# Patient Record
Sex: Female | Born: 1960 | Race: White | Hispanic: No | Marital: Married | Smoking: Never smoker
Health system: Southern US, Community
[De-identification: ages and names within clinical notes are randomized; demographics above are authoritative.]

## PROBLEM LIST (undated history)

## (undated) DIAGNOSIS — F419 Anxiety disorder, unspecified: Secondary | ICD-10-CM

## (undated) HISTORY — PX: TUBAL LIGATION: SHX77

---

## 2008-03-04 ENCOUNTER — Ambulatory Visit: Payer: Self-pay | Admitting: Internal Medicine

## 2010-07-24 ENCOUNTER — Ambulatory Visit: Payer: Self-pay | Admitting: Internal Medicine

## 2011-10-13 ENCOUNTER — Ambulatory Visit: Payer: Self-pay | Admitting: Family Medicine

## 2013-11-06 ENCOUNTER — Ambulatory Visit: Payer: Self-pay | Admitting: Emergency Medicine

## 2015-06-29 ENCOUNTER — Observation Stay
Admission: EM | Admit: 2015-06-29 | Discharge: 2015-06-30 | Disposition: A | Discharge status: Home / Self Care | Payer: BLUE CROSS/BLUE SHIELD | Attending: Orthopedic Surgery | Admitting: Orthopedic Surgery

## 2015-06-29 ENCOUNTER — Emergency Department: Payer: BLUE CROSS/BLUE SHIELD

## 2015-06-29 ENCOUNTER — Encounter: Payer: Self-pay | Admitting: Emergency Medicine

## 2015-06-29 DIAGNOSIS — S52572A Other intraarticular fracture of lower end of left radius, initial encounter for closed fracture: Principal | ICD-10-CM | POA: Insufficient documentation

## 2015-06-29 DIAGNOSIS — S5291XA Unspecified fracture of right forearm, initial encounter for closed fracture: Secondary | ICD-10-CM | POA: Diagnosis present

## 2015-06-29 DIAGNOSIS — W1789XA Other fall from one level to another, initial encounter: Secondary | ICD-10-CM | POA: Diagnosis not present

## 2015-06-29 DIAGNOSIS — S5292XA Unspecified fracture of left forearm, initial encounter for closed fracture: Secondary | ICD-10-CM

## 2015-06-29 DIAGNOSIS — S62101A Fracture of unspecified carpal bone, right wrist, initial encounter for closed fracture: Secondary | ICD-10-CM | POA: Diagnosis present

## 2015-06-29 DIAGNOSIS — W19XXXA Unspecified fall, initial encounter: Secondary | ICD-10-CM

## 2015-06-29 DIAGNOSIS — S52571A Other intraarticular fracture of lower end of right radius, initial encounter for closed fracture: Secondary | ICD-10-CM | POA: Diagnosis not present

## 2015-06-29 DIAGNOSIS — Z8781 Personal history of (healed) traumatic fracture: Secondary | ICD-10-CM

## 2015-06-29 DIAGNOSIS — S62102A Fracture of unspecified carpal bone, left wrist, initial encounter for closed fracture: Secondary | ICD-10-CM

## 2015-06-29 DIAGNOSIS — Z9889 Other specified postprocedural states: Secondary | ICD-10-CM

## 2015-06-29 LAB — MRSA PCR SCREENING: MRSA by PCR: NEGATIVE

## 2015-06-29 LAB — CBC WITH DIFFERENTIAL/PLATELET
BASOS ABS: 0.1 10*3/uL (ref 0–0.1)
Basophils Relative: 1 %
EOS PCT: 0 %
Eosinophils Absolute: 0 10*3/uL (ref 0–0.7)
HEMATOCRIT: 34.4 % — AB (ref 35.0–47.0)
Hemoglobin: 11.7 g/dL — ABNORMAL LOW (ref 12.0–16.0)
LYMPHS ABS: 1.9 10*3/uL (ref 1.0–3.6)
LYMPHS PCT: 19 %
MCH: 31.2 pg (ref 26.0–34.0)
MCHC: 34 g/dL (ref 32.0–36.0)
MCV: 91.9 fL (ref 80.0–100.0)
MONO ABS: 0.5 10*3/uL (ref 0.2–0.9)
Monocytes Relative: 5 %
NEUTROS ABS: 7.8 10*3/uL — AB (ref 1.4–6.5)
Neutrophils Relative %: 75 %
PLATELETS: 225 10*3/uL (ref 150–440)
RBC: 3.74 MIL/uL — AB (ref 3.80–5.20)
RDW: 13 % (ref 11.5–14.5)
WBC: 10.3 10*3/uL (ref 3.6–11.0)

## 2015-06-29 LAB — BASIC METABOLIC PANEL
ANION GAP: 4 — AB (ref 5–15)
BUN: 13 mg/dL (ref 6–20)
CO2: 25 mmol/L (ref 22–32)
Calcium: 8.4 mg/dL — ABNORMAL LOW (ref 8.9–10.3)
Chloride: 106 mmol/L (ref 101–111)
Creatinine, Ser: 0.61 mg/dL (ref 0.44–1.00)
GFR calc Af Amer: >60 mL/min (ref >60)
GLUCOSE: 136 mg/dL — AB (ref 65–99)
POTASSIUM: 3.9 mmol/L (ref 3.5–5.1)
Sodium: 135 mmol/L (ref 135–145)

## 2015-06-29 LAB — ABO/RH: ABO/RH(D): A NEG

## 2015-06-29 LAB — TYPE AND SCREEN
ABO/RH(D): A NEG
ANTIBODY SCREEN: NEGATIVE

## 2015-06-29 LAB — PROTIME-INR
INR: 1.03
Prothrombin Time: 13.7 seconds (ref 11.4–15.0)

## 2015-06-29 MED ORDER — CEFAZOLIN SODIUM 1-5 GM-% IV SOLN
1.0000 g | Freq: Once | INTRAVENOUS | Status: DC
Start: 2015-06-30 — End: 2015-06-30
  Filled 2015-06-29: qty 50

## 2015-06-29 MED ORDER — MORPHINE SULFATE (PF) 2 MG/ML IV SOLN
INTRAVENOUS | Status: AC
  Administered 2015-06-29: 2 mg via INTRAMUSCULAR
  Filled 2015-06-29: qty 1

## 2015-06-29 MED ORDER — INFLUENZA VAC SPLIT QUAD 0.5 ML IM SUSY: 0.5000 mL | PREFILLED_SYRINGE | INTRAMUSCULAR | Status: DC

## 2015-06-29 MED ORDER — MORPHINE SULFATE (PF) 4 MG/ML IV SOLN
INTRAVENOUS | Status: AC
  Administered 2015-06-29: 6 mg via INTRAMUSCULAR
  Filled 2015-06-29: qty 1

## 2015-06-29 MED ORDER — MORPHINE SULFATE (PF) 2 MG/ML IV SOLN
2.0000 mg | INTRAVENOUS | Status: DC | PRN
  Administered 2015-06-29 – 2015-06-30 (×2): 2 mg via INTRAVENOUS
  Filled 2015-06-29 (×2): qty 1

## 2015-06-29 MED ORDER — HYDROCODONE-ACETAMINOPHEN 7.5-325 MG PO TABS
1.0000 | ORAL_TABLET | ORAL | Status: DC | PRN
  Administered 2015-06-29 – 2015-06-30 (×2): 2 via ORAL
  Filled 2015-06-29 (×2): qty 2

## 2015-06-29 MED ORDER — SODIUM CHLORIDE 0.9 % IV SOLN
INTRAVENOUS | Status: DC
  Administered 2015-06-29 (×2): via INTRAVENOUS

## 2015-06-29 MED ORDER — MORPHINE SULFATE (PF) 4 MG/ML IV SOLN
6.0000 mg | Freq: Once | INTRAVENOUS | Status: AC
  Administered 2015-06-29: 6 mg via INTRAMUSCULAR

## 2015-06-29 NOTE — H&P (Signed)
Subjective:   Patient is a 55 y.o. female presents with bilateral wrist pain. Onset of symptoms was abrupt starting 7 hours ago with unchanged course since that time. The pain is located bilateral wrists. Patient describes the pain as sharp continuous and rated as moderate. Pain has been associated with no numbness. Patient denies loss of consciousness. Symptoms are aggravated by moving fingers. Patient slipped off a box at home while cleaning blinds landing on outstretched both wrists. She does have a history of a prior proximal pole excision as a child  Patient Active Problem List   Diagnosis Date Noted  . Wrist fracture, bilateral 06/29/2015   No past medical history on file.  No past surgical history on file.   (Not in a hospital admission) Allergies  Allergen Reactions  . Sulfa Antibiotics     Social History  Substance Use Topics  . Smoking status: Never Smoker   . Smokeless tobacco: Not on file  . Alcohol Use: No    No family history on file.  Review of Systems Pertinent items are noted in HPI.  Objective:   Patient Vitals for the past 8 hrs:  BP Temp Pulse Resp SpO2 Height Weight  06/29/15 1832 - - 65 - 98 % - -  06/29/15 1831 (!) 100/59 mmHg - - - - - -  06/29/15 1605 98/60 mmHg 98.1 F (36.7 C) 74 18 100 % 5\' 2"  (1.575 m) 54.432 kg (120 lb)          BP 100/59 mmHg  Pulse 65  Temp(Src) 98.1 F (36.7 C)  Resp 18  Ht 5\' 2"  (1.575 m)  Wt 54.432 kg (120 lb)  BMI 21.94 kg/m2  SpO2 98% General appearance: alert, appears stated age and mild distress Lungs: clear to auscultation bilaterally Heart: regular rate and rhythm, S1, S2 normal, no murmur, click, rub or gallop Extremities: Both wrists are in splints. There is silver-fork deformity bilateral. Sensation intact to the thumb and index and little fingers. Brisk capillary refill   Data ReviewRadiology review: Bilateral wrist x-rays reveal displaced bilateral wrist fractures with significant dorsal angulation  and shortening, there is intra-articular extension and both fractures  Assessment:   Active Problems:   Wrist fracture, bilateral   Plan:   With bilateral wrist fractures with the significant displacement there is significant loss of function and recommended treatment is bilateral wrist ORIF to allow for early mobilization and finger motion. Graylon GoodRissman was possible competitions discussed

## 2015-06-29 NOTE — ED Provider Notes (Signed)
[facility] Emergency Department Provider Note    ____________________________________________  Time seen: 1725  I have reviewed the triage vital signs and the nursing notes.   HISTORY  Chief Complaint Wrist Pain   History limited by: Not Limited   HPI Shirley Rose is a 55 y.o. female who presents to the emergency department today with bilateral wrist pain after a fall. The patient states that she was standing up on all support while she was cleaning the blinds on the support broke. She fell backwards. She put both of her hands behind her to try to catch herself. She had immediate onset of sharp pain in both hands. She also hit her tailbone however states that that is mild pain. The patient continues to have pain in her bilateral wrists. She denies hitting her head or any loss of consciousness.     No past medical history on file.  Patient Active Problem List   Diagnosis Date Noted  . Wrist fracture, bilateral 06/29/2015    No past surgical history on file.  No current outpatient prescriptions on file.  Allergies Sulfa antibiotics  No family history on file.  Social History Social History  Substance Use Topics  . Smoking status: Never Smoker   . Smokeless tobacco: Not on file  . Alcohol Use: No    Review of Systems  Constitutional: Negative for fever. Cardiovascular: Negative for chest pain. Respiratory: Negative for shortness of breath. Gastrointestinal: Negative for abdominal pain, vomiting and diarrhea. Neurological: Negative for headaches, focal weakness or numbness.   10-point ROS otherwise negative.  ____________________________________________   PHYSICAL EXAM:  VITAL SIGNS: ED Triage Vitals  Enc Vitals Group     BP 06/29/15 1605 98/60 mmHg     Pulse Rate 06/29/15 1605 74     Resp 06/29/15 1605 18     Temp 06/29/15 1605 98.1 F (36.7 C)     Temp src --      SpO2 06/29/15 1605 100 %     Weight 06/29/15 1605 120  lb (54.432 kg)     Height 06/29/15 1605 5\' 2"  (1.575 m)     Head Cir --      Peak Flow --      Pain Score 06/29/15 1605 10   Constitutional: Alert and oriented. Well appearing and in no distress. Eyes: Conjunctivae are normal. PERRL. Normal extraocular movements. ENT   Head: Normocephalic and atraumatic.   Nose: No congestion/rhinnorhea.   Mouth/Throat: Mucous membranes are moist.   Neck: No stridor. No midline tenderness. Hematological/Lymphatic/Immunilogical: No cervical lymphadenopathy. Cardiovascular: Normal rate, regular rhythm.  No murmurs, rubs, or gallops. Respiratory: Normal respiratory effort without tachypnea nor retractions. Breath sounds are clear and equal bilaterally. No wheezes/rales/rhonchi. Gastrointestinal: Soft and nontender. No distention.  Genitourinary: Deferred Musculoskeletal: Bilateral tenderness and deformities to wrist. Neurovascularly intact distally. No other extremity deformities. No spinal tenderness. Neurologic:  Normal speech and language. No gross focal neurologic deficits are appreciated.  Skin:  Skin is warm, dry and intact. No rash noted. Psychiatric: Mood and affect are normal. Speech and behavior are normal. Patient exhibits appropriate insight and judgment.  ____________________________________________    LABS (pertinent positives/negatives)  Labs Reviewed  CBC WITH DIFFERENTIAL/PLATELET - Abnormal; Notable for the following:    RBC 3.74 (*)    Hemoglobin 11.7 (*)    HCT 34.4 (*)    Neutro Abs 7.8 (*)    All other components within normal limits  BASIC METABOLIC PANEL - Abnormal; Notable for the  following:    Glucose, Bld 136 (*)    Calcium 8.4 (*)    Anion gap 4 (*)    All other components within normal limits  PROTIME-INR  TYPE AND SCREEN  ABO/RH     ____________________________________________   EKG  I, Phineas Semen, attending physician, personally viewed and interpreted this EKG  EKG Time: 1828 Rate:  63 Rhythm: NSR Axis: normal Intervals: qtc 405 QRS: narrow ST changes: no st elevation Impression:normal ekg  ____________________________________________    RADIOLOGY  Right wrist  IMPRESSION: Acute fracture of the distal radius. Deformity of the scaphoid and lunate do not definitively appear acute and may be related to prior Trauma.  Left wrist  IMPRESSION: Comminuted distal left radial fracture.    I, Garvis Downum, personally viewed and evaluated these images (plain radiographs) as part of my medical decision making. ____________________________________________   PROCEDURES  Procedure(s) performed: None  Critical Care performed: No  ____________________________________________   INITIAL IMPRESSION / ASSESSMENT AND PLAN / ED COURSE  Pertinent labs & imaging results that were available during my care of the patient were reviewed by me and considered in my medical decision making (see chart for details).  Patient presented to the emergency department today with bilateral wrist pain after a fall. X-ray showed bilateral radial fractures. No other injuries. Patient will be admitted under the orthopedic surgery service.  ____________________________________________   FINAL CLINICAL IMPRESSION(S) / ED DIAGNOSES  Final diagnoses:  Fall, initial encounter  Radius fracture, left, closed, initial encounter  Radius fracture, right, closed, initial encounter     Phineas Semen, MD 06/29/15 [id number]

## 2015-06-29 NOTE — ED Notes (Signed)
Larey SeatFell and caught herself - has bil wrist pain and swelling, also tailbone pain. Took 2 tylenol 15 mins ago

## 2015-06-30 ENCOUNTER — Observation Stay: Payer: BLUE CROSS/BLUE SHIELD | Admitting: Anesthesiology

## 2015-06-30 ENCOUNTER — Observation Stay: Payer: BLUE CROSS/BLUE SHIELD

## 2015-06-30 ENCOUNTER — Encounter
Admission: EM | Disposition: A | Discharge status: Home / Self Care | Payer: Self-pay | Source: Home / Self Care | Attending: Emergency Medicine

## 2015-06-30 ENCOUNTER — Encounter: Payer: Self-pay | Admitting: *Deleted

## 2015-06-30 HISTORY — PX: ORIF WRIST FRACTURE: SHX2133

## 2015-06-30 SURGERY — OPEN REDUCTION INTERNAL FIXATION (ORIF) WRIST FRACTURE
Anesthesia: General | Site: Wrist | Laterality: Bilateral | Wound class: Clean

## 2015-06-30 MED ORDER — HYDROCODONE-ACETAMINOPHEN 7.5-325 MG PO TABS: 1.0000 | ORAL_TABLET | ORAL | Status: DC | PRN

## 2015-06-30 MED ORDER — FENTANYL CITRATE (PF) 100 MCG/2ML IJ SOLN
INTRAMUSCULAR | Status: DC | PRN
  Administered 2015-06-30: 10 ug via INTRAVENOUS

## 2015-06-30 MED ORDER — ONDANSETRON HCL 4 MG/2ML IJ SOLN
INTRAMUSCULAR | Status: AC
Start: 2015-06-30 — End: 2015-06-30
  Administered 2015-06-30: 4 mg
  Filled 2015-06-30: qty 2

## 2015-06-30 MED ORDER — METOCLOPRAMIDE HCL 10 MG PO TABS: 5.0000 mg | ORAL_TABLET | Freq: Three times a day (TID) | ORAL | Status: DC | PRN

## 2015-06-30 MED ORDER — PROMETHAZINE HCL 25 MG/ML IJ SOLN: 6.2500 mg | INTRAMUSCULAR | Status: DC | PRN

## 2015-06-30 MED ORDER — ACETAMINOPHEN 10 MG/ML IV SOLN
INTRAVENOUS | Status: DC | PRN
  Administered 2015-06-30: 1000 mg via INTRAVENOUS

## 2015-06-30 MED ORDER — MIDAZOLAM HCL 2 MG/2ML IJ SOLN
INTRAMUSCULAR | Status: DC | PRN
  Administered 2015-06-30: 2 mg via INTRAVENOUS

## 2015-06-30 MED ORDER — EPHEDRINE SULFATE 50 MG/ML IJ SOLN
INTRAMUSCULAR | Status: DC | PRN
  Administered 2015-06-30 (×2): 10 mg via INTRAVENOUS

## 2015-06-30 MED ORDER — PHENYLEPHRINE HCL 10 MG/ML IJ SOLN
INTRAMUSCULAR | Status: DC | PRN
  Administered 2015-06-30: 100 ug via INTRAVENOUS

## 2015-06-30 MED ORDER — PROPOFOL 10 MG/ML IV BOLUS
INTRAVENOUS | Status: DC | PRN
  Administered 2015-06-30: 140 mg via INTRAVENOUS

## 2015-06-30 MED ORDER — LIDOCAINE HCL (CARDIAC) 20 MG/ML IV SOLN
INTRAVENOUS | Status: DC | PRN
  Administered 2015-06-30: 100 mg via INTRAVENOUS

## 2015-06-30 MED ORDER — ONDANSETRON HCL 4 MG/2ML IJ SOLN
4.0000 mg | INTRAMUSCULAR | Status: DC | PRN
  Administered 2015-06-30: 4 mg via INTRAVENOUS
  Filled 2015-06-30: qty 2

## 2015-06-30 MED ORDER — SODIUM CHLORIDE 0.9 % IV SOLN
INTRAVENOUS | Status: DC
  Administered 2015-06-30: 12:00:00 via INTRAVENOUS

## 2015-06-30 MED ORDER — FENTANYL CITRATE (PF) 100 MCG/2ML IJ SOLN
25.0000 ug | INTRAMUSCULAR | Status: DC | PRN
  Administered 2015-06-30 (×4): 25 ug via INTRAVENOUS

## 2015-06-30 MED ORDER — NEOMYCIN-POLYMYXIN B GU 40-200000 IR SOLN
Status: DC | PRN
  Administered 2015-06-30: 2 mL

## 2015-06-30 MED ORDER — LACTATED RINGERS IV SOLN
INTRAVENOUS | Status: DC
  Administered 2015-06-30 (×2): via INTRAVENOUS

## 2015-06-30 MED ORDER — DEXAMETHASONE SODIUM PHOSPHATE 10 MG/ML IJ SOLN
INTRAMUSCULAR | Status: DC | PRN
  Administered 2015-06-30: 10 mg via INTRAVENOUS

## 2015-06-30 MED ORDER — MORPHINE SULFATE (PF) 2 MG/ML IV SOLN
2.0000 mg | Freq: Once | INTRAVENOUS | Status: AC
  Administered 2015-06-30: 2 mg via INTRAVENOUS
  Filled 2015-06-30: qty 1

## 2015-06-30 MED ORDER — KETOROLAC TROMETHAMINE 30 MG/ML IJ SOLN
INTRAMUSCULAR | Status: DC | PRN
  Administered 2015-06-30: 30 mg via INTRAVENOUS

## 2015-06-30 MED ORDER — HYDROCODONE-ACETAMINOPHEN 5-325 MG PO TABS
1.0000 | ORAL_TABLET | ORAL | Status: DC | PRN
  Administered 2015-06-30: 2 via ORAL
  Filled 2015-06-30 (×2): qty 1

## 2015-06-30 MED ORDER — METOCLOPRAMIDE HCL 5 MG/ML IJ SOLN: 5.0000 mg | Freq: Three times a day (TID) | INTRAMUSCULAR | Status: DC | PRN

## 2015-06-30 MED ORDER — FENTANYL CITRATE (PF) 100 MCG/2ML IJ SOLN
INTRAMUSCULAR | Status: AC
  Administered 2015-06-30: 25 ug via INTRAVENOUS
  Filled 2015-06-30: qty 2

## 2015-06-30 MED ORDER — ONDANSETRON HCL 4 MG/2ML IJ SOLN: 4.0000 mg | Freq: Once | INTRAMUSCULAR | Status: DC

## 2015-06-30 MED ORDER — ONDANSETRON HCL 4 MG/2ML IJ SOLN
4.0000 mg | Freq: Once | INTRAMUSCULAR | Status: AC
  Administered 2015-06-30: 4 mg via INTRAVENOUS

## 2015-06-30 SURGICAL SUPPLY — 49 items
BANDAGE ELASTIC 4 LF NS (GAUZE/BANDAGES/DRESSINGS) ×3 IMPLANT
BIT DRILL 2 FAST STEP (BIT) ×3 IMPLANT
BIT DRILL 2.5X4 QC (BIT) ×3 IMPLANT
BNDG ESMARK 4X12 TAN STRL LF (GAUZE/BANDAGES/DRESSINGS) ×3 IMPLANT
CANISTER SUCT 1200ML W/VALVE (MISCELLANEOUS) ×3 IMPLANT
CAST PADDING 3X4FT ST 30246 (SOFTGOODS)
CHLORAPREP W/TINT 26ML (MISCELLANEOUS) IMPLANT
CORD BIP STRL DISP 12FT (MISCELLANEOUS) ×3 IMPLANT
DRAPE FLUOR MINI C-ARM 54X84 (DRAPES) ×3 IMPLANT
DRAPE U-SHAPE 47X51 STRL (DRAPES) ×6 IMPLANT
DRSG EMULSION OIL 3X8 NADH (GAUZE/BANDAGES/DRESSINGS) ×3 IMPLANT
DURAPREP 6ML APPLICATOR 50/CS (WOUND CARE) ×6 IMPLANT
ELECT CAUTERY BLADE 6.4 (BLADE) ×3 IMPLANT
FORCEPS JEWEL BIP 4-3/4 STR (INSTRUMENTS) ×3 IMPLANT
GAUZE SPONGE 4X4 12PLY STRL (GAUZE/BANDAGES/DRESSINGS) ×3 IMPLANT
GLOVE BIOGEL PI IND STRL 9 (GLOVE) ×1 IMPLANT
GLOVE BIOGEL PI INDICATOR 9 (GLOVE) ×2
GLOVE SURG ORTHO 9.0 STRL STRW (GLOVE) ×3 IMPLANT
GOWN SPECIALTY ULTRA XL (MISCELLANEOUS) ×3 IMPLANT
GOWN STRL REUS W/ TWL LRG LVL3 (GOWN DISPOSABLE) ×1 IMPLANT
GOWN STRL REUS W/TWL 2XL LVL3 (GOWN DISPOSABLE) IMPLANT
GOWN STRL REUS W/TWL LRG LVL3 (GOWN DISPOSABLE) ×2
K-WIRE 1.6 (WIRE) ×2
K-WIRE FX5X1.6XNS BN SS (WIRE) ×1
KIT RM TURNOVER STRD PROC AR (KITS) ×3 IMPLANT
KWIRE FX5X1.6XNS BN SS (WIRE) ×1 IMPLANT
NEEDLE HYPO 25X1 1.5 SAFETY (NEEDLE) ×3 IMPLANT
NS IRRIG 500ML POUR BTL (IV SOLUTION) ×3 IMPLANT
PACK EXTREMITY ARMC (MISCELLANEOUS) ×3 IMPLANT
PAD CAST CTTN 3X4 STRL (SOFTGOODS) IMPLANT
PAD CAST CTTN 4X4 STRL (SOFTGOODS) IMPLANT
PAD GROUND ADULT SPLIT (MISCELLANEOUS) ×3 IMPLANT
PADDING CAST 3IN STRL (MISCELLANEOUS) ×4
PADDING CAST BLEND 3X4 STRL (MISCELLANEOUS) ×2 IMPLANT
PADDING CAST COTTON 4X4 STRL (SOFTGOODS)
PEG SUBCHONDRAL SMOOTH 2.0X14 (Peg) ×6 IMPLANT
PEG SUBCHONDRAL SMOOTH 2.0X18 (Peg) ×3 IMPLANT
PEG SUBCHONDRAL SMOOTH 2.0X20 (Peg) ×12 IMPLANT
PEG SUBCHONDRAL SMOOTH 2.0X22 (Peg) ×15 IMPLANT
PLATE SHORT 24.4X51.3 LT (Plate) ×3 IMPLANT
PLATE STAN 21.6X57.2 NRRW RT (Plate) ×3 IMPLANT
SCREW CORT 3.5X10 LNG (Screw) ×9 IMPLANT
SPLINT CAST 1 STEP 3X12 (MISCELLANEOUS) ×3 IMPLANT
STOCKINETTE STRL 3IN 960336 (SOFTGOODS) ×3 IMPLANT
STOCKINETTE STRL 4IN 9604848 (GAUZE/BANDAGES/DRESSINGS) ×3 IMPLANT
SUT ETHILON 4 0 P 3 18 (SUTURE) ×6 IMPLANT
SUT VIC AB 3-0 SH 27 (SUTURE) ×2
SUT VIC AB 3-0 SH 27X BRD (SUTURE) ×1 IMPLANT
SYRINGE 10CC LL (SYRINGE) ×3 IMPLANT

## 2015-06-30 NOTE — Progress Notes (Signed)
Spoke with Dr. Rosita KeaMenz, requested medication for nausea. Orders placed.

## 2015-06-30 NOTE — Progress Notes (Signed)
Per Dr. Rosita KeaMenz advised to proceed with discharge to home.

## 2015-06-30 NOTE — Progress Notes (Signed)
MD paged for anti-emetic.

## 2015-06-30 NOTE — Progress Notes (Signed)
Completed discharge instructions with patient and spouse.  Patient prescription given to husband.

## 2015-06-30 NOTE — OR Nursing (Signed)
Xray completed in PACU

## 2015-06-30 NOTE — Discharge Instructions (Signed)
Keep arms elevated is much as possible. Work fingers is much as possible. Okay to use fingers to do self-care and for eating. Keep splint clean and dry. Call for appointment tomorrow for a Friday

## 2015-06-30 NOTE — Care Management (Signed)
Patient currently off unit to surgery to wrist. RNCM will continue to follow for discharge planning needs.

## 2015-06-30 NOTE — Anesthesia Procedure Notes (Signed)
Procedure Name: LMA Insertion Date/Time: 06/30/2015 9:52 AM Performed by: Junious SilkNOLES, Shirley Kamm Pre-anesthesia Checklist: Patient identified, Patient being monitored, Timeout performed, Emergency Drugs available and Suction available Patient Re-evaluated:Patient Re-evaluated prior to inductionOxygen Delivery Method: Circle system utilized Preoxygenation: Pre-oxygenation with 100% oxygen Intubation Type: IV induction Ventilation: Mask ventilation without difficulty LMA: LMA inserted LMA Size: 3.5 Tube type: Oral Number of attempts: 1 Placement Confirmation: positive ETCO2 and breath sounds checked- equal and bilateral Tube secured with: Tape Dental Injury: Teeth and Oropharynx as per pre-operative assessment

## 2015-06-30 NOTE — Progress Notes (Signed)
Report given to Same Day Surgery.  Patient prepped and ready for surgery.

## 2015-06-30 NOTE — Op Note (Signed)
06/29/2015 - 06/30/2015  10:54 AM  PATIENT:  Shirley Rose  55 y.o. female  PRE-OPERATIVE DIAGNOSIS:  wrist fracture bilateral  POST-OPERATIVE DIAGNOSIS:  wrist fracture bilateral  PROCEDURE:  Procedure(s): OPEN REDUCTION INTERNAL FIXATION (ORIF) WRIST FRACTURE (Bilateral)  SURGEON: Leitha SchullerMichael J Donyae Kilner, MD  ASSISTANTS: None  ANESTHESIA:   general  EBL:  Total I/O In: 800 [I.V.:800] Out: -   BLOOD ADMINISTERED:none  DRAINS: none   LOCAL MEDICATIONS USED:  NONE  SPECIMEN:  No Specimen  DISPOSITION OF SPECIMEN:  N/A  COUNTS:  YES  TOURNIQUET:   21 minutes on left 25 minutes on right at 2 50 mmHg   IMPLANTS:  Short narrow DVR on left narrow standard DVR on the right with multiple screws and pegs  DICTATION: .Dragon Dictation patient was brought to the operating room and after adequate general anesthesia was obtained, both arms were prepped and draped in sterile fashion with a tourniquet below the elbow. After patient identification and timeout procedures were completed, left tourniquet was raised to 225 and subsequently up to 250 for adequate tourniquet effect. A volar incision was made centered over the FCR tendon. The tendon was retracted radially protect the radial artery and vein. The pronator was identified and elevated off the shaft and distal fragment. Futrell traction applied to the index and middle finger and length restored but angulation persisted. Short narrow DVR plate was applied with a distal first technique drilling multiple pegs holes and then bringing the plate down to the shaft restoring volar tilt. Length radial inclination and volar tilt all seem to be restored essentially anatomically. The joint articular surface also appeared congruent mini C-arm views angling down from the styloid as well as volar oblique views. The shaft was fixed with THREE 10 mm screws tourniquet was let down and hemostasis checked electrocautery. Wound was irrigated and closed with 3-0 Vicryl  subcutaneously and 4-0 nylon skin closure. The identical procedures carried out on the right wrist with a standard length plate is the shorter plate was not available. The distal first technique was again carried out and when the plate was brought down most of the volar tilt was restored loss length and radial inclination. The wound was again irrigated and tourniquet let down wound closed in a similar fashion at this point both wounds were dressed with Xeroform 4 x 4 web roll and a volar splint with three-inch Ace wrap applied PLAN OF CARE: Discharge to home after PACU  PATIENT DISPOSITION:  PACU - hemodynamically stable.

## 2015-06-30 NOTE — Transfer of Care (Signed)
Immediate Anesthesia Transfer of Care Note  Patient: Shirley CordialLinda M Rana  Procedure(s) Performed: Procedure(s): OPEN REDUCTION INTERNAL FIXATION (ORIF) WRIST FRACTURE (Bilateral)  Patient Location: PACU  Anesthesia Type:General  Level of Consciousness: sedated  Airway & Oxygen Therapy: Patient Spontanous Breathing and Patient connected to face mask oxygen  Post-op Assessment: Report given to RN and Post -op Vital signs reviewed and stable  Post vital signs: Reviewed and stable  Last Vitals:  Filed Vitals:   06/30/15 0751 06/30/15 0830  BP: 90/51 121/56  Pulse: 76 71  Temp: 36.7 C 36.9 C  Resp: 18 18    Complications: No apparent anesthesia complications

## 2015-06-30 NOTE — Anesthesia Preprocedure Evaluation (Addendum)
Anesthesia Evaluation  Patient identified by MRN, date of birth, ID band Patient awake    Reviewed: Allergy & Precautions, H&P , NPO status , Patient's Chart, lab work & pertinent test results, reviewed documented beta blocker date and time   History of Anesthesia Complications Negative for: history of anesthetic complications  Airway Mallampati: III  TM Distance: >3 FB Neck ROM: full    Dental no notable dental hx. (+) Teeth Intact   Pulmonary neg pulmonary ROS,    Pulmonary exam normal breath sounds clear to auscultation       Cardiovascular Exercise Tolerance: Good negative cardio ROS Normal cardiovascular exam Rhythm:regular Rate:Normal     Neuro/Psych negative neurological ROS  negative psych ROS   GI/Hepatic negative GI ROS, Neg liver ROS,   Endo/Other  negative endocrine ROS  Renal/GU negative Renal ROS  negative genitourinary   Musculoskeletal   Abdominal   Peds  Hematology negative hematology ROS (+)   Anesthesia Other Findings History reviewed. No pertinent past medical history.   Reproductive/Obstetrics negative OB ROS                             Anesthesia Physical Anesthesia Plan  ASA: I  Anesthesia Plan: General   Post-op Pain Management:    Induction:   Airway Management Planned:   Additional Equipment:   Intra-op Plan:   Post-operative Plan:   Informed Consent: I have reviewed the patients History and Physical, chart, labs and discussed the procedure including the risks, benefits and alternatives for the proposed anesthesia with the patient or authorized representative who has indicated his/her understanding and acceptance.   Dental Advisory Given  Plan Discussed with: Anesthesiologist, CRNA and Surgeon  Anesthesia Plan Comments:         Anesthesia Quick Evaluation

## 2015-07-01 ENCOUNTER — Encounter: Payer: Self-pay | Admitting: Orthopedic Surgery

## 2015-07-01 NOTE — Anesthesia Postprocedure Evaluation (Signed)
Anesthesia Post Note  Patient: Shirley CordialLinda M Eilert  Procedure(s) Performed: Procedure(s) (LRB): OPEN REDUCTION INTERNAL FIXATION (ORIF) WRIST FRACTURE (Bilateral)  Patient location during evaluation: PACU Anesthesia Type: General Level of consciousness: awake and alert Pain management: pain level controlled Vital Signs Assessment: post-procedure vital signs reviewed and stable Respiratory status: spontaneous breathing, nonlabored ventilation, respiratory function stable and patient connected to nasal cannula oxygen Cardiovascular status: blood pressure returned to baseline and stable Postop Assessment: no signs of nausea or vomiting Anesthetic complications: no    Last Vitals:  Filed Vitals:   06/30/15 1621 06/30/15 1637  BP: 93/47 109/58  Pulse: 72 72  Temp: 36.8 C   Resp: 18     Last Pain:  Filed Vitals:   06/30/15 1638  PainSc: 3                  Lenard SimmerAndrew Gracyn Allor

## 2015-07-02 NOTE — Progress Notes (Signed)
Patient stable post op Discharge to home Follow up 3-5 days for wound check

## 2015-07-22 ENCOUNTER — Ambulatory Visit: Payer: BLUE CROSS/BLUE SHIELD | Attending: Orthopedic Surgery | Admitting: Occupational Therapy

## 2015-07-22 DIAGNOSIS — M25631 Stiffness of right wrist, not elsewhere classified: Secondary | ICD-10-CM

## 2015-07-22 DIAGNOSIS — M79632 Pain in left forearm: Secondary | ICD-10-CM

## 2015-07-22 DIAGNOSIS — M25632 Stiffness of left wrist, not elsewhere classified: Secondary | ICD-10-CM | POA: Diagnosis present

## 2015-07-22 DIAGNOSIS — M6281 Muscle weakness (generalized): Secondary | ICD-10-CM | POA: Diagnosis present

## 2015-07-22 NOTE — Patient Instructions (Signed)
Heat prior  Scar massage ( ed on and hand out provided) Scar pad night time   AROM for wrist flexion , extention, UD and RD and sup/pro   10 reps 2-3 x day   Can start prayer stretch for wrist extention if pain better in 5 days

## 2015-07-22 NOTE — Therapy (Signed)
[facility] [address] [place], [address] Phone: [phone]   Fax:  [phone]  Occupational Therapy Treatment  Patient Details  Name: VIRGINIE JOSTEN MRN: [id number] Date of Birth: 12/22/1960 Referring Provider: Rosita Kea  Encounter Date: 07/22/2015      OT End of Session - 07/22/15 1444    Visit Number 1   Number of Visits 6   Date for OT Re-Evaluation 09/02/15   OT Start Time 1338   OT Stop Time 1425   OT Time Calculation (min) 47 min   Activity Tolerance Patient tolerated treatment well;Patient limited by pain   Behavior During Therapy [facility] for tasks assessed/performed      No past medical history on file.  Past Surgical History  Procedure Laterality Date  . Orif wrist fracture Bilateral 06/30/2015    Procedure: OPEN REDUCTION INTERNAL FIXATION (ORIF) WRIST FRACTURE;  Surgeon: Kennedy Bucker, MD;  Location: [facility] ORS;  Service: Orthopedics;  Laterality: Bilateral;    There were no vitals filed for this visit.  Visit Diagnosis:  Pain of left forearm  Stiffness of left wrist joint  Stiffness of right wrist joint  Muscle weakness      Subjective Assessment - 07/22/15 1435    Subjective  I had surgery on the 9th - went back to work week later - sterri strips come off last night - he said that I can take splints off at home and  weight limited 5 lbs    Patient Stated Goals Want to get my Range of motion better and strength so I can use  them for typing, gripping , liifting , open ojbects  and less pain    Currently in Pain? No/denies            [facility] OT Assessment - 07/22/15 0001    Assessment   Diagnosis Bilateral wrist ORIF   Referring Provider menz   Onset Date 06/30/15   Assessment Pt present  3 wks postop from wrist ORIF bilateral - refer for ROM and grip strenght    Precautions   Precaution Comments not lifting more than 5 lbs - no pulling , lifting or pushing    Required Braces or  Orthoses Other Brace/Splint   Balance Screen   Has the patient fallen in the past 6 months Yes   How many times? 1   Has the patient had a decrease in activity level because of a fear of falling?  No   Is the patient reluctant to leave their home because of a fear of falling?  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 [employer] - pt is R hand dominant  likes to play with grand children - 2 ,4 ,6 and 8 yrs old , cook , house work , reading tablet    AROM   Right Forearm Pronation 90 Degrees   Right Forearm Supination 90 Degrees   Left Forearm Pronation 85 Degrees  pain   Left Forearm Supination 55 Degrees  Pain    Right Wrist Extension 35 Degrees   Right Wrist Flexion 58 Degrees   Right Wrist Radial Deviation 5 Degrees   Right Wrist Ulnar Deviation 18 Degrees   Left Wrist Extension 45 Degrees   Left Wrist Flexion 68 Degrees   Left Wrist Radial Deviation 8  Degrees   Left Wrist Ulnar Deviation 32 Degrees          Fluido therapy for L wrist to decrease pain and increase ROM prior to review of HEP                 OT Education - 07/22/15 1444    Education provided Yes   Education Details Home program- precautions    Person(s) Educated Patient   Methods Explanation;Demonstration;Tactile cues;Verbal cues;Handout   Comprehension Verbal cues required;Returned demonstration;Verbalized understanding          OT Short Term Goals - 07/22/15 1459    OT SHORT TERM GOAL #1   Title AROM in  bilateral wrists  improve to [facility] with no pain to use in ADL's   Baseline see flowsheet- pain increase to 8/10   Time 3   Period Weeks   Status New   OT SHORT TERM GOAL #2   Title Pt to be ind in HEP to decrease pain, scar tissue , and increase ROM to perform work with pain lessthan 2/10   Baseline pain can increase to 8/10 - very little knowledge on HEP    Time  3   Period Weeks   Status New           OT Long Term Goals - 07/22/15 1501    OT LONG TERM GOAL #1   Title Wrist strength improve for pt to use 2 lbs and able to increase function on PRWHE by 15 points    Baseline Function on PRWHE 24/50   Time 4   Period Weeks   Status New   OT LONG TERM GOAL #2   Title Grip strength improve to above 25 lbs to return to prior level of function    Baseline NT yet   Time 6   Period Weeks   Status New   OT LONG TERM GOAL #3   Title Pain on PRWHE improve by at least 15 points    Baseline pain on PRWHE 24/50   Time 4   Period Weeks   Status New               Plan - 07/22/15 1445    Clinical Impression Statement Pt present 3wks postop from bilateral wrist ORIF's - pt had decrease ROM , increase pain L more than R - in need for ed on scar management - decrease functional use and decrease strength - all limiiting her use of biilateral hands in ADL's /IADL's - pt report  going back to work  week after surgery -  and typing really bothers her - ed pt on  modiffications and homeprogram    Pt will benefit from skilled therapeutic intervention in order to improve on the following deficits (Retired) Decreased range of motion;Impaired flexibility;Decreased knowledge of precautions;Decreased scar mobility;Impaired UE functional use;Pain;Decreased strength   Rehab Potential Excellent   OT Frequency 1x / week   OT Duration 6 weeks   OT Treatment/Interventions Self-care/ADL training;Parrafin;Fluidtherapy;Patient/family education;Therapeutic exercises;Scar mobilization;Passive range of motion;Manual Therapy   Plan assess pain in L , and  AROM in all planes    OT Home Exercise Plan see pt instruciton    Consulted and Agree with Plan of Care Patient        Problem List Patient Active Problem List   Diagnosis Date Noted  . Wrist fracture, bilateral 06/29/2015    Oletta Cohn OTR/L,CLT 07/22/2015, 3:07 PM  [facility] [address], [place], [address] Phone: [phone]   Fax:  [phone]  Name: IDALIZ TINKLE MRN: [id number] Date of Birth: Oct 16, 1960

## 2015-07-29 ENCOUNTER — Ambulatory Visit: Payer: BLUE CROSS/BLUE SHIELD | Attending: Orthopedic Surgery | Admitting: Occupational Therapy

## 2015-07-29 DIAGNOSIS — M25632 Stiffness of left wrist, not elsewhere classified: Secondary | ICD-10-CM

## 2015-07-29 DIAGNOSIS — M25631 Stiffness of right wrist, not elsewhere classified: Secondary | ICD-10-CM

## 2015-07-29 DIAGNOSIS — M79632 Pain in left forearm: Secondary | ICD-10-CM | POA: Diagnosis present

## 2015-07-29 DIAGNOSIS — M6281 Muscle weakness (generalized): Secondary | ICD-10-CM

## 2015-07-29 NOTE — Patient Instructions (Signed)
Same - but add kinesiotape to palmar scar on L  And fitted with neoprene Benik splint for daytime use

## 2015-07-29 NOTE — Therapy (Signed)
[facility] [address], [place], [address] Phone: [phone]   Fax:  [phone]  Occupational Therapy Treatment  Patient Details  Name: SIMONNE BOULOS MRN: [id number] Date of Birth: [birth year] Referring Provider: Rosita Kea  Encounter Date: 07/29/2015      OT End of Session - 07/29/15 1431    Visit Number 2   Number of Visits 6   Date for OT Re-Evaluation 09/02/15   OT Start Time 1344   OT Stop Time 1428   OT Time Calculation (min) 44 min   Activity Tolerance Patient tolerated treatment well;Patient limited by pain   Behavior During Therapy [facility] for tasks assessed/performed      No past medical history on file.  Past Surgical History  Procedure Laterality Date  . Orif wrist fracture Bilateral 06/30/2015    Procedure: OPEN REDUCTION INTERNAL FIXATION (ORIF) WRIST FRACTURE;  Surgeon: Kennedy Bucker, MD;  Location: [facility] ORS;  Service: Orthopedics;  Laterality: Bilateral;    There were no vitals filed for this visit.  Visit Diagnosis:  Pain of left forearm  Stiffness of left wrist joint  Stiffness of right wrist joint  Muscle weakness      Subjective Assessment - 07/29/15 1403    Subjective  I don't understand my L hand is more painfull -  I'm having hard time doing my job - and I am so stiff in the am after wearing my splints - takes me a while to get them moving    Patient Stated Goals Want to get my Range of motion better and strength so I can use  them for typing, gripping , liifting , open ojbects  and less pain    Currently in Pain? Yes   Pain Score 3    Pain Location Wrist   Pain Orientation Left   Pain Descriptors / Indicators Burning            [facility] OT Assessment - 07/29/15 0001    AROM   Right Wrist Extension 50 Degrees   Right Wrist Flexion 53 Degrees   Right Wrist Radial Deviation 12 Degrees   Right Wrist Ulnar Deviation 18 Degrees   Left Wrist Extension 52 Degrees   Left Wrist  Flexion 70 Degrees   Left Wrist Radial Deviation 12 Degrees   Left Wrist Ulnar Deviation 28 Degrees                  OT Treatments/Exercises (OP) - 07/29/15 0001    Wrist Exercises   Other wrist exercises AROM for wrist in all planes bilateral 10 degrees - needed min A to docorrect - measurement taken at [facility] - see flowhsheet   RUE Fluidotherapy   Number Minutes Fluidotherapy 12 Minutes   RUE Fluidotherapy Location Wrist   Comments AROM in all planes to decrease pain and increase ROM    LUE Fluidotherapy   Number Minutes Fluidotherapy 12 Minutes   LUE Fluidotherapy Location Wrist   Comments decrease pain and increase ROM    Manual Therapy   Manual therapy comments Scar mobs to bilateral scars - L more adhere - pt taped with kinesiotape for scar mobs - ed on precautions - 20% pull parallel and across 3 pieces at 10% pull                OT Education - 07/29/15 1431    Education provided Yes   Education Details HEP   Person(s) Educated Patient   Methods  Explanation;Demonstration;Tactile cues   Comprehension Returned demonstration;Verbalized understanding          OT Short Term Goals - 07/22/15 1459    OT SHORT TERM GOAL #1   Title AROM in  bilateral wrists  improve to [facility] with no pain to use in ADL's   Baseline see flowsheet- pain increase to 8/10   Time 3   Period Weeks   Status New   OT SHORT TERM GOAL #2   Title Pt to be ind in HEP to decrease pain, scar tissue , and increase ROM to perform work with pain lessthan 2/10   Baseline pain can increase to 8/10 - very little knowledge on HEP    Time 3   Period Weeks   Status New           OT Long Term Goals - 07/22/15 1501    OT LONG TERM GOAL #1   Title Wrist strength improve for pt to use 2 lbs and able to increase function on PRWHE by 15 points    Baseline Function on PRWHE 24/50   Time 4   Period Weeks   Status New   OT LONG TERM GOAL #2   Title Grip strength improve to above 25 lbs to return  to prior level of function    Baseline NT yet   Time 6   Period Weeks   Status New   OT LONG TERM GOAL #3   Title Pain on PRWHE improve by at least 15 points    Baseline pain on PRWHE 24/50   Time 4   Period Weeks   Status New               Plan - 07/29/15 1431    Clinical Impression Statement Pt  just over 4 wks from bilateral ORIF - pain still more on the L - scar adhere more and tender - pt fitted with Benikneoprene splint for support and decrease pain - kinesiotape done to palmar scar    Pt will benefit from skilled therapeutic intervention in order to improve on the following deficits (Retired) Decreased range of motion;Impaired flexibility;Decreased knowledge of precautions;Decreased scar mobility;Impaired UE functional use;Pain;Decreased strength   Rehab Potential Excellent   OT Frequency 1x / week   OT Duration 6 weeks   OT Treatment/Interventions Self-care/ADL training;Parrafin;Fluidtherapy;Patient/family education;Therapeutic exercises;Scar mobilization;Passive range of motion;Manual Therapy   Plan how tape done, pain and use of splint    OT Home Exercise Plan see pt instruciton    Consulted and Agree with Plan of Care Patient        Problem List Patient Active Problem List   Diagnosis Date Noted  . Wrist fracture, bilateral 06/29/2015    Oletta Cohn OTR/L,CLT 07/29/2015, 2:34 PM  [place] [facility] [address] [place], [address] Phone: [phone]   Fax:  [phone]  Name: SHALECE STAFFA MRN: [id number] Date of Birth: 14-Dec-1960

## 2015-08-04 ENCOUNTER — Ambulatory Visit: Payer: BLUE CROSS/BLUE SHIELD | Admitting: Occupational Therapy

## 2015-08-04 DIAGNOSIS — M25632 Stiffness of left wrist, not elsewhere classified: Secondary | ICD-10-CM

## 2015-08-04 DIAGNOSIS — M79632 Pain in left forearm: Secondary | ICD-10-CM | POA: Diagnosis not present

## 2015-08-04 DIAGNOSIS — M6281 Muscle weakness (generalized): Secondary | ICD-10-CM

## 2015-08-04 DIAGNOSIS — M25631 Stiffness of right wrist, not elsewhere classified: Secondary | ICD-10-CM

## 2015-08-04 NOTE — Therapy (Signed)
[place] [facility] [address] [place], [address] Phone: [phone]   Fax:  [phone]  Occupational Therapy Treatment  Patient Details  Name: Shirley Rose MRN: [id number] Date of Birth: 11-20-1960 Referring Provider: Rosita Kea  Encounter Date: 08/04/2015      OT End of Session - 08/04/15 1457    Visit Number 3   Number of Visits 6   Date for OT Re-Evaluation 09/02/15   OT Start Time 1333   OT Stop Time 1413   OT Time Calculation (min) 40 min   Activity Tolerance Patient tolerated treatment well;Patient limited by pain   Behavior During Therapy [facility] for tasks assessed/performed      No past medical history on file.  Past Surgical History  Procedure Laterality Date  . Orif wrist fracture Bilateral 06/30/2015    Procedure: OPEN REDUCTION INTERNAL FIXATION (ORIF) WRIST FRACTURE;  Surgeon: Kennedy Bucker, MD;  Location: [facility] ORS;  Service: Orthopedics;  Laterality: Bilateral;    There were no vitals filed for this visit.  Visit Diagnosis:  Pain of left forearm  Stiffness of left wrist joint  Stiffness of right wrist joint  Muscle weakness      Subjective Assessment - 08/04/15 1341    Subjective  I can definetly tell difference  since last time - I love the soft splints - helps for the pain - and I can move it better- the twisting - my husband did replace one time the tape on my wrist   Patient Stated Goals Want to get my Range of motion better and strength so I can use  them for typing, gripping , liifting , open ojbects  and less pain    Currently in Pain? Yes   Pain Score 1    Pain Location Wrist   Pain Orientation Left   Pain Descriptors / Indicators Aching                      OT Treatments/Exercises (OP) - 08/04/15 0001    Wrist Exercises   Other wrist exercises Add PROM for wrist prayer stretch  for wrist extnetion , and flexoin with hand on posterior hand 1 0reps each; PROM at edge  for table for flexion /extention, RD and UD - follwed by AROM in all palnes    Other wrist exercises PROM  and placde and hold for supination - then AROM  in painfree range    Hand Exercises   Other Hand Exercises light blue putty for grip , lat and 3 point - as well as pulling with no increaese pain    RUE Fluidotherapy   Number Minutes Fluidotherapy 10 Minutes   RUE Fluidotherapy Location Wrist   Comments AROM of wrist in all planes at [facility] to increase ROM and decrease pain    LUE Fluidotherapy   Number Minutes Fluidotherapy 10 Minutes   LUE Fluidotherapy Location Wrist   Comments decrease pain and increase ROM at [facility]    Manual Therapy   Manual therapy comments Scar mobs to bilateral scars -still adhere some and distla more than pros-provided pt with   pt with kinesiotape for scar mobs - ed on precautions - 20% pull parallel and across 3 pieces at 10% pull- husband to do at home                 OT Education - 08/04/15 1349    Education provided Yes   Education Details HEP  Person(s) Educated Patient   Methods Explanation;Demonstration;Tactile cues;Verbal cues;Handout   Comprehension Verbal cues required;Returned demonstration;Verbalized understanding          OT Short Term Goals - 07/22/15 1459    OT SHORT TERM GOAL #1   Title AROM in  bilateral wrists  improve to [facility] with no pain to use in ADL's   Baseline see flowsheet- pain increase to 8/10   Time 3   Period Weeks   Status New   OT SHORT TERM GOAL #2   Title Pt to be ind in HEP to decrease pain, scar tissue , and increase ROM to perform work with pain lessthan 2/10   Baseline pain can increase to 8/10 - very little knowledge on HEP    Time 3   Period Weeks   Status New           OT Long Term Goals - 07/22/15 1501    OT LONG TERM GOAL #1   Title Wrist strength improve for pt to use 2 lbs and able to increase function on PRWHE by 15 points    Baseline Function on PRWHE 24/50   Time 4   Period Weeks    Status New   OT LONG TERM GOAL #2   Title Grip strength improve to above 25 lbs to return to prior level of function    Baseline NT yet   Time 6   Period Weeks   Status New   OT LONG TERM GOAL #3   Title Pain on PRWHE improve by at least 15 points    Baseline pain on PRWHE 24/50   Time 4   Period Weeks   Status New               Plan - 08/04/15 1458    Clinical Impression Statement Pt making progress in pain since last time with use of Benik splints - was able to start PROM this date  without increase pain - still use Benik splint -also started some light putty - pt to see MD tomorrow and pt this date 5wks postop    Pt will benefit from skilled therapeutic intervention in order to improve on the following deficits (Retired) Decreased range of motion;Impaired flexibility;Decreased knowledge of precautions;Decreased scar mobility;Impaired UE functional use;Pain;Decreased strength   Rehab Potential Excellent   OT Frequency 2x / week   OT Duration 4 weeks   OT Treatment/Interventions Self-care/ADL training;Parrafin;Fluidtherapy;Patient/family education;Therapeutic exercises;Scar mobilization;Passive range of motion;Manual Therapy   Plan assess pain, HOW doing with upgrade of HEP and MD appt    OT Home Exercise Plan see pt instruciton    Consulted and Agree with Plan of Care Patient        Problem List Patient Active Problem List   Diagnosis Date Noted  . Wrist fracture, bilateral 06/29/2015    Shirley Rose OTR/L,CLT  08/04/2015, 3:01 PM  [place] [facility] [address], [place], [address] Phone: [phone]   Fax:  [phone]  Name: Shirley Rose MRN: [id number] Date of Birth: [birth year]

## 2015-08-04 NOTE — Patient Instructions (Addendum)
PROM for wrist flexion and extention/RD and UD  over edge of table  10 reps   And then PROM supination with place and hold to L  - without increase pain  AROM in all planes  Can also do prayer stretch for wrist extention inbetween   Light blue putty for gripping, lat and 3 point grip - and pulling with all digits  10 reps each  Cont with Benik soft splints as needed

## 2015-08-08 ENCOUNTER — Ambulatory Visit: Payer: BLUE CROSS/BLUE SHIELD | Admitting: Occupational Therapy

## 2015-08-08 DIAGNOSIS — M6281 Muscle weakness (generalized): Secondary | ICD-10-CM

## 2015-08-08 DIAGNOSIS — M25631 Stiffness of right wrist, not elsewhere classified: Secondary | ICD-10-CM

## 2015-08-08 DIAGNOSIS — M25632 Stiffness of left wrist, not elsewhere classified: Secondary | ICD-10-CM

## 2015-08-08 DIAGNOSIS — M79632 Pain in left forearm: Secondary | ICD-10-CM

## 2015-08-08 NOTE — Patient Instructions (Signed)
PROM bilateral RD/UD , flexion and extnetion  Pain less 1-2/10 AROM  - and then 1 lbs for R hand flexion /ext/RD and UD   PROM attempt on L but pain less than 1-2/10   Light blue putty for R hand - pain less than 1-2/10

## 2015-08-08 NOTE — Therapy (Signed)
[place] [facility] [address], [place], [address] Phone: [phone]   Fax:  [phone]  Occupational Therapy Treatment  Patient Details  Name: Shirley Rose MRN: [id number] Date of Birth: 06-17-1961 Referring Provider: Rosita Kea  Encounter Date: 08/08/2015      OT End of Session - 08/08/15 1104    Visit Number 4   Number of Visits 6   Date for OT Re-Evaluation 09/02/15   OT Start Time 1000   OT Stop Time 1045   OT Time Calculation (min) 45 min   Activity Tolerance Patient tolerated treatment well;Patient limited by pain   Behavior During Therapy [facility] for tasks assessed/performed      No past medical history on file.  Past Surgical History  Procedure Laterality Date  . Orif wrist fracture Bilateral 06/30/2015    Procedure: OPEN REDUCTION INTERNAL FIXATION (ORIF) WRIST FRACTURE;  Surgeon: Kennedy Bucker, MD;  Location: [facility] ORS;  Service: Orthopedics;  Laterality: Bilateral;    There were no vitals filed for this visit.  Visit Diagnosis:  Pain of left forearm  Stiffness of left wrist joint  Stiffness of right wrist joint  Muscle weakness      Subjective Assessment - 08/08/15 1013    Subjective  I seen the PA and DR Wed - thought the plate shifted a little on the  L - but then phone and said DR Rosita Kea looked at it and okay - can continue - will follow up in 2 wks - my L wrist just has burning pain on the side    Patient Stated Goals Want to get my Range of motion better and strength so I can use  them for typing, gripping , liifting , open ojbects  and less pain    Currently in Pain? Yes   Pain Score 4    Pain Location Wrist   Pain Orientation Left   Pain Descriptors / Indicators Burning   Pain Radiating Towards when typing or moving it    Pain Onset 1 to 4 weeks ago            [facility] OT Assessment - 08/08/15 0001    AROM   Left Forearm Supination 45 Degrees   Right Wrist Extension 50 Degrees   Right Wrist Flexion 60 Degrees   Left Wrist Extension 42 Degrees   Left Wrist Flexion 65 Degrees                  OT Treatments/Exercises (OP) - 08/08/15 0001    Wrist Exercises   Other wrist exercises Gentle PROM for L wrist flexion /ext, RD and UD - followed by AROM with extention open hand and close hand ; gentle PROM attempt but pain - only AROM    Other wrist exercises R wrist PROM flexion /extetntion, RD and UD - follwed by AROM in all planes - then 1 lbs in all planes R hand    Hand Exercises   Other Hand Exercises light blue putty for  R hand 10 reps - pain less 1-2/10   RUE Fluidotherapy   Number Minutes Fluidotherapy 10 Minutes   RUE Fluidotherapy Location Wrist   Comments AROM for wrist in all planes , decrease pain and increase ROM at [facility]    LUE Fluidotherapy   Number Minutes Fluidotherapy 10 Minutes   LUE Fluidotherapy Location Wrist   Comments AROM for wrist in all planes at [facility] to decrease pain and icnreae ROM  Manual Therapy   Manual therapy comments Scar massage done on L still adhere - applied kinestiotape 10 % pull parallel and 3 across at 100% pull - pt had before - know precatuions                 OT Education - 08/08/15 1104    Education provided Yes   Education Details HEP   Person(s) Educated Patient   Methods Explanation;Demonstration;Tactile cues;Verbal cues;Handout   Comprehension Verbal cues required;Returned demonstration;Verbalized understanding          OT Short Term Goals - 07/22/15 1459    OT SHORT TERM GOAL #1   Title AROM in  bilateral wrists  improve to [facility] with no pain to use in ADL's   Baseline see flowsheet- pain increase to 8/10   Time 3   Period Weeks   Status New   OT SHORT TERM GOAL #2   Title Pt to be ind in HEP to decrease pain, scar tissue , and increase ROM to perform work with pain lessthan 2/10   Baseline pain can increase to 8/10 - very little knowledge on HEP    Time 3   Period Weeks   Status New            OT Long Term Goals - 07/22/15 1501    OT LONG TERM GOAL #1   Title Wrist strength improve for pt to use 2 lbs and able to increase function on PRWHE by 15 points    Baseline Function on PRWHE 24/50   Time 4   Period Weeks   Status New   OT LONG TERM GOAL #2   Title Grip strength improve to above 25 lbs to return to prior level of function    Baseline NT yet   Time 6   Period Weeks   Status New   OT LONG TERM GOAL #3   Title Pain on PRWHE improve by at least 15 points    Baseline pain on PRWHE 24/50   Time 4   Period Weeks   Status New               Plan - 08/08/15 1105    Clinical Impression Statement Pt showed decrease ROM this date in sup , flexion and extnetion on L wrist - but R imprving - showed increase motion on L but not supination and pain with attempt PROM goes up to 5/10 - pt to only do AROM and attempt PROM sup L to 1-2/10 pain - light  putty on R - pt 6 wks postop    Pt will benefit from skilled therapeutic intervention in order to improve on the following deficits (Retired) Decreased range of motion;Impaired flexibility;Decreased knowledge of precautions;Decreased scar mobility;Impaired UE functional use;Pain;Decreased strength   Rehab Potential Excellent   OT Frequency 1x / week   OT Duration 4 weeks   OT Treatment/Interventions Self-care/ADL training;Parrafin;Fluidtherapy;Patient/family education;Therapeutic exercises;Scar mobilization;Passive range of motion;Manual Therapy   Plan assess pain , progress on the L    OT Home Exercise Plan see pt instruciton    Consulted and Agree with Plan of Care Patient        Problem List Patient Active Problem List   Diagnosis Date Noted  . Wrist fracture, bilateral 06/29/2015    Oletta Cohn OTR/l,CLT 08/08/2015, 11:47 AM  [place] [facility] PHYSICAL AND SPORTS MEDICINE [address], [place], [address] Phone: [phone]   Fax:  [phone]  Name: Shirley Rose MRN: [id number] Date of Birth: October 10, 1960

## 2015-08-15 ENCOUNTER — Ambulatory Visit: Payer: BLUE CROSS/BLUE SHIELD | Admitting: Occupational Therapy

## 2015-08-15 DIAGNOSIS — M25631 Stiffness of right wrist, not elsewhere classified: Secondary | ICD-10-CM

## 2015-08-15 DIAGNOSIS — M25632 Stiffness of left wrist, not elsewhere classified: Secondary | ICD-10-CM

## 2015-08-15 DIAGNOSIS — M79632 Pain in left forearm: Secondary | ICD-10-CM | POA: Diagnosis not present

## 2015-08-15 DIAGNOSIS — M6281 Muscle weakness (generalized): Secondary | ICD-10-CM

## 2015-08-15 NOTE — Therapy (Signed)
[place] [facility] [address] [place], [address] Phone: [phone]   Fax:  [phone]  Occupational Therapy Treatment  Patient Details  Name: Shirley Rose MRN: [id number] Date of Birth: 24-Oct-1960 Referring Provider: Rosita Kea  Encounter Date: 08/15/2015      OT End of Session - 08/15/15 1445    Visit Number 5   Number of Visits 6   Date for OT Re-Evaluation 09/02/15   OT Start Time 1300   OT Stop Time 1348   OT Time Calculation (min) 48 min   Activity Tolerance Patient tolerated treatment well;Patient limited by pain   Behavior During Therapy [facility] for tasks assessed/performed      No past medical history on file.  Past Surgical History  Procedure Laterality Date  . Orif wrist fracture Bilateral 06/30/2015    Procedure: OPEN REDUCTION INTERNAL FIXATION (ORIF) WRIST FRACTURE;  Surgeon: Kennedy Bucker, MD;  Location: [facility] ORS;  Service: Orthopedics;  Laterality: Bilateral;    There were no vitals filed for this visit.  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 No/denies            [facility] OT Assessment - 08/15/15 0001    Strength   Right Hand Grip (lbs) 26   Right Hand Lateral Pinch 10 lbs   Right Hand 3 Point Pinch 10 lbs   Left Hand Grip (lbs) 5   Left Hand Lateral Pinch 3 lbs   Left Hand 3 Point Pinch 3 lbs                  OT  Treatments/Exercises (OP) - 08/15/15 0001    Wrist Exercises   Other wrist exercises AROM for sup with arm to side stabilize upper arm , using wheel sup adn then place and hold 10 reps each sup ; 1 lbs sup  10 reps - L ; 1 lbs for wrist extention 2 x 5 reps , UD and RD 1 lbs    Other wrist exercises R wrist 2 lbs in all planes 1 0reps    Hand Exercises   Other Hand Exercises teal putty on R grip , 3 point , lat grip ; L lighb blue but pain free only    RUE Fluidotherapy   Number Minutes Fluidotherapy 10 Minutes   RUE Fluidotherapy Location Wrist   Comments AROM to wriist at [facility] to increase Rom and decreasepain    LUE Fluidotherapy   Number Minutes Fluidotherapy 10 Minutes   LUE Fluidotherapy Location Wrist   Comments SOC increase ROM and decreas pain                 OT Education - 08/15/15 1445    Education provided Yes  Education Details HEP   Person(s) Educated Patient   Methods Explanation;Demonstration;Tactile cues;Verbal cues;Handout   Comprehension Verbal cues required;Returned demonstration;Verbalized understanding          OT Short Term Goals - 08/15/15 1448    OT SHORT TERM GOAL #1   Title AROM in  bilateral wrists  improve to [facility] with no pain to use in ADL's   Baseline R improve greatly - L pain and supination limitied    Time 3   Status On-going   OT SHORT TERM GOAL #2   Title Pt to be ind in HEP to decrease pain, scar tissue , and increase ROM to perform work with pain lessthan 2/10   Baseline problem with supination - and progress on L - R doing great    Time 3   Period Weeks   Status On-going           OT Long Term Goals - 08/15/15 1449    OT LONG TERM GOAL #1   Title Wrist strength improve for pt to use 2 lbs and able to increase function on PRWHE by 15 points    Baseline 2 lbs on the R wrist , only started some 1 lbs on L - but pain lmiting progress in supination    Time 4   Period Weeks   Status On-going   OT LONG TERM GOAL #2   Title  Grip strength improve to above 25 lbs to return to prior level of function    Baseline Grip 26 R , L 5 lbs - and pain on L    Time 4   Period Weeks   Status On-going   OT LONG TERM GOAL #3   Title Pain on PRWHE improve by at least 15 points    Baseline pain on PRWHE 24/50   Time 4   Period Weeks   Status On-going               Plan - 08/15/15 1446    Clinical Impression Statement Pt making fantastic progress in R wrist and hand - but L supinatoin still painful, and imparied - as well aspain with grip , wrist extention on ulnar side - pt to do contreast - did use supination wheel this date - AAROM and place and hold - was able to do some 1 lbs - and 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 - was able to get her to 55-60 degrees with pain less than 4/10    Pt will benefit from skilled therapeutic intervention in order to improve on the following deficits (Retired) Decreased range of motion;Impaired flexibility;Decreased knowledge of precautions;Decreased scar mobility;Impaired UE functional use;Pain;Decreased strength   Rehab Potential Excellent   OT Frequency 1x / week   OT Duration 4 weeks   OT Treatment/Interventions Self-care/ADL training;Parrafin;Fluidtherapy;Patient/family education;Therapeutic exercises;Scar mobilization;Passive range of motion;Manual Therapy   Plan assess pain , progress - appt with MD next Friday    OT Home Exercise Plan see pt instruciton    Consulted and Agree with Plan of Care Patient        Problem List Patient Active Problem List   Diagnosis Date Noted  . Wrist fracture, bilateral 06/29/2015    Oletta Cohn  OTR/l,CLT   08/15/2015, 2:51 PM  [place] [facility]TS [facility]ICINE [address], [place], [address] Phone: [phone]   Fax:  [phone]  Name: Shirley Rose MRN: [id number] Date of Birth: January 25, 1961

## 2015-08-15 NOTE — Patient Instructions (Signed)
L hand borrow supination wheel  - AAROM and place and hold after AROM  - to do every hour or 2  Can try contrast for pain 1 lbs sup;RD and UD and wrist extention  2 x day   10 reps  Light blue putty grip - 10 reps - pain free  R Teal putty grip , lat and 3 point  2 lbs wrist in all planes - 1 0reps 2 x day

## 2015-08-21 ENCOUNTER — Ambulatory Visit: Payer: BLUE CROSS/BLUE SHIELD | Attending: Orthopedic Surgery | Admitting: Occupational Therapy

## 2015-08-21 DIAGNOSIS — M6281 Muscle weakness (generalized): Secondary | ICD-10-CM | POA: Diagnosis present

## 2015-08-21 DIAGNOSIS — M79632 Pain in left forearm: Secondary | ICD-10-CM | POA: Diagnosis present

## 2015-08-21 DIAGNOSIS — M25632 Stiffness of left wrist, not elsewhere classified: Secondary | ICD-10-CM | POA: Diagnosis present

## 2015-08-21 DIAGNOSIS — M25631 Stiffness of right wrist, not elsewhere classified: Secondary | ICD-10-CM | POA: Diagnosis present

## 2015-08-21 NOTE — Therapy (Signed)
[place] [facility]ICINE [address], [place], [address] Phone: [phone]   Fax:  [phone]  Occupational Therapy Treatment  Patient Details  Name: KELENA GARROW MRN: [id number] Date of Birth: 1960/07/22 Referring Provider: Rosita Kea  Encounter Date: 08/21/2015      OT End of Session - 08/21/15 1345    Visit Number 6   Number of Visits 9   Date for OT Re-Evaluation 09/11/15   OT Start Time 1301   OT Stop Time 1344   OT Time Calculation (min) 43 min   Activity Tolerance Patient tolerated treatment well;Patient limited by pain   Behavior During Therapy [facility] for tasks assessed/performed      No past medical history on file.  Past Surgical History  Procedure Laterality Date  . Orif wrist fracture Bilateral 06/30/2015    Procedure: OPEN REDUCTION INTERNAL FIXATION (ORIF) WRIST FRACTURE;  Surgeon: Kennedy Bucker, MD;  Location: [facility] ORS;  Service: Orthopedics;  Laterality: Bilateral;    There were no vitals filed for this visit.  Visit Diagnosis:  Pain of left forearm - Plan: Ot plan of care cert/re-cert  Stiffness of left wrist joint - Plan: Ot plan of care cert/re-cert  Stiffness of right wrist joint - Plan: Ot plan of care cert/re-cert  Muscle weakness - Plan: Ot plan of care cert/re-cert      Subjective Assessment - 08/21/15 1330    Subjective  See DR Rosita Kea tomorrow - I just cannot et my palm to turn , and then pain inside - one night I really got it good - I love the ring thing to exercise - I feel like  I can do more weight on the R    Patient Stated Goals Want to get my Range of motion better and strength so I can use  them for typing, gripping , liifting , open ojbects  and less pain    Currently in Pain? Yes   Pain Score 1    Pain Location Wrist   Pain Orientation Left   Pain Descriptors / Indicators Aching   Pain Onset More than a month ago            [facility] OT Assessment - 08/21/15 0001    AROM   Left Forearm Supination 48 Degrees   Right Wrist Extension 44 Degrees   Right Wrist Flexion 77 Degrees   Right Wrist Radial Deviation 12 Degrees   Right Wrist Ulnar Deviation 24 Degrees   Left Wrist Extension 48 Degrees   Left Wrist Flexion 65 Degrees   Left Wrist Radial Deviation 10 Degrees   Left Wrist Ulnar Deviation 35 Degrees   Strength   Right Hand Grip (lbs) 28   Right Hand Lateral Pinch 11 lbs   Right Hand 3 Point Pinch 12 lbs   Left Hand Grip (lbs) 11   Left Hand Lateral Pinch 9 lbs   Left Hand 3 Point Pinch 7 lbs                  OT Treatments/Exercises (OP) - 08/21/15 0001    Wrist Exercises   Other wrist exercises Prayer stretch , add table slide s fo rboth but do  not start L until see MD tomorrow - but did not had pain    Other wrist exercises 2 lbs for R 2 sets at 12 reps - all planes - and then 1 lbs all planes 2  sets on L - pain with supination  and pronation    Hand Exercises   Other Hand Exercises R hand upgrade green putty for grip , lat and 3 point grip    Other Hand Exercises L hand still light blue - putty grip - unable to increase to teal - had incrase pain    RUE Fluidotherapy   Number Minutes Fluidotherapy 10 Minutes   RUE Fluidotherapy Location Wrist   Comments AROM in all planes    LUE Fluidotherapy   Number Minutes Fluidotherapy 10 Minutes   LUE Fluidotherapy Location Wrist   Comments Increase AROM at [facility] - decrease pain                 OT Education - 08/21/15 1345    Education provided Yes   Education Details HEP    Person(s) Educated Patient   Methods Explanation;Demonstration;Tactile cues;Verbal cues;Handout   Comprehension Verbal cues required;Returned demonstration;Verbalized understanding          OT Short Term Goals - 08/21/15 1347    OT SHORT TERM GOAL #1   Title AROM in  bilateral wrists  improve to [facility] with no pain to use in ADL's   Baseline R improve greatly - L pain and supination limitied    Time 3    Period Weeks   Status On-going   OT SHORT TERM GOAL #2   Title Pt to be ind in HEP to decrease pain, scar tissue , and increase ROM to perform work with pain lessthan 2/10   Baseline problem with supination - and progress on L - R doing great    Time 3   Period Weeks   Status On-going           OT Long Term Goals - 08/21/15 1347    OT LONG TERM GOAL #1   Title Wrist strength improve for pt to use 2 lbs and able to increase function on PRWHE by 15 points    Baseline 2 lbs on the R wrist , only started some 1 lbs on L - but pain lmiting progress in supination    Time 4   Period Weeks   Status On-going   OT LONG TERM GOAL #2   Title Grip strength improve to above 25 lbs to return to prior level of function    Baseline Grip 26 R , L 11lbs - and pain on L    Time 4   Period Weeks   OT LONG TERM GOAL #3   Title Pain on PRWHE improve by at least 15 points    Baseline pain on PRWHE 24/50   Time 4   Period Weeks   Status On-going               Plan - 08/21/15 1345    Clinical Impression Statement Pt making great progress but pain and tightness limtiing her progress in supunation on L and grip - but did show increase since last time - was able to upgrade R weight and putty - and add table slides to prepare for weight bearing act - pt to see MD tomorrow    Pt will benefit from skilled therapeutic intervention in order to improve on the following deficits (Retired) Decreased range of motion;Impaired flexibility;Decreased knowledge of precautions;Decreased scar mobility;Impaired UE functional use;Pain;Decreased strength   Rehab Potential Excellent   OT Frequency 1x / week   OT Duration 4 weeks   OT Treatment/Interventions Self-care/ADL training;Parrafin;Fluidtherapy;Patient/family education;Therapeutic exercises;Scar mobilization;Passive range of motion;Manual Therapy   Plan info about MD  appt - how HEP and progress is    OT Home Exercise Plan see pt instruciton    Consulted and  Agree with Plan of Care Patient        Problem List Patient Active Problem List   Diagnosis Date Noted  . Wrist fracture, bilateral 06/29/2015    Oletta Cohn  OTR/L,CLT   08/21/2015, 1:50 PM  [place] [facility] SPORTS MEDICINE [address], [place], [address] Phone: [phone]   Fax:  [phone]  Name: GRISSEL TYRELL MRN: [id number] Date of Birth: 06/02/1961

## 2015-08-21 NOTE — Patient Instructions (Signed)
Same focus on sup and with wheel   but 2 sets of 2lbs for R wrist  And 2 sets for 1 lbs on L   Upgrade to green putty for R  - in 3 days increase to 2 sets  And cont with same L light blue and reps on L

## 2015-08-22 ENCOUNTER — Encounter: Payer: BLUE CROSS/BLUE SHIELD | Admitting: Occupational Therapy

## 2015-09-15 ENCOUNTER — Encounter: Payer: Self-pay | Admitting: *Deleted

## 2015-09-15 ENCOUNTER — Other Ambulatory Visit: Payer: BLUE CROSS/BLUE SHIELD

## 2015-09-17 NOTE — Patient Instructions (Signed)
  Your procedure is scheduled on: 09-18-15 Report to [facility] 2ND FLOOR To find out your arrival time please call [phone] between 1PM - 3PM on 09-17-15  Remember: Instructions that are not followed completely may result in serious medical risk, up to and including death, or upon the discretion of your surgeon and anesthesiologist your surgery may need to be rescheduled.    _X___ 1. Do not eat food or drink liquids after midnight. No gum chewing or hard candies.     _X___ 2. No Alcohol for 24 hours before or after surgery.   ____ 3. Bring all medications with you on the day of surgery if instructed.    ____ 4. Notify your doctor if there is any change in your medical condition     (cold, fever, infections).     Do not wear jewelry, make-up, hairpins, clips or nail polish.  Do not wear lotions, powders, or perfumes. You may wear deodorant.  Do not shave 48 hours prior to surgery. Men may shave face and neck.  Do not bring valuables to the hospital.    [facility] is not responsible for any belongings or valuables.               Contacts, dentures or bridgework may not be worn into surgery.  Leave your suitcase in the car. After surgery it may be brought to your room.  For patients admitted to the hospital, discharge time is determined by your treatment team.   Patients discharged the day of surgery will not be allowed to drive home.   Please read over the following fact sheets that you were given:      ____ Take these medicines the morning of surgery with A SIP OF WATER:    1. NONE  2.   3.   4.  5.  6.  ____ Fleet Enema (as directed)   ____ Use CHG Soap as directed  ____ Use inhalers on the day of surgery  ____ Stop metformin 2 days prior to surgery    ____ Take 1/2 of usual insulin dose the night before surgery and none on the morning of surgery.   ____ Stop Coumadin/Plavix/aspirin-N/A  ____ Stop Anti-inflammatories-NO NSAIDS OR ASA  PRODUCTS-TYLENOL OK TO TAKE   ____ Stop supplements until after surgery.    ____ Bring C-Pap to the hospital.  

## 2015-09-18 ENCOUNTER — Ambulatory Visit: Payer: BLUE CROSS/BLUE SHIELD | Admitting: Certified Registered"

## 2015-09-18 ENCOUNTER — Encounter
Admission: RE | Disposition: A | Discharge status: Home / Self Care | Payer: Self-pay | Source: Ambulatory Visit | Attending: Orthopedic Surgery

## 2015-09-18 ENCOUNTER — Encounter: Payer: Self-pay | Admitting: Orthopedic Surgery

## 2015-09-18 ENCOUNTER — Ambulatory Visit
Admission: RE | Admit: 2015-09-18 | Discharge: 2015-09-18 | Disposition: A | Discharge status: Home / Self Care | Payer: BLUE CROSS/BLUE SHIELD | Source: Ambulatory Visit | Attending: Orthopedic Surgery | Admitting: Orthopedic Surgery

## 2015-09-18 DIAGNOSIS — E559 Vitamin D deficiency, unspecified: Secondary | ICD-10-CM | POA: Insufficient documentation

## 2015-09-18 DIAGNOSIS — Z882 Allergy status to sulfonamides status: Secondary | ICD-10-CM | POA: Diagnosis not present

## 2015-09-18 DIAGNOSIS — Y834 Other reconstructive surgery as the cause of abnormal reaction of the patient, or of later complication, without mention of misadventure at the time of the procedure: Secondary | ICD-10-CM | POA: Diagnosis not present

## 2015-09-18 DIAGNOSIS — Y92234 Operating room of hospital as the place of occurrence of the external cause: Secondary | ICD-10-CM | POA: Insufficient documentation

## 2015-09-18 DIAGNOSIS — M810 Age-related osteoporosis without current pathological fracture: Secondary | ICD-10-CM | POA: Diagnosis not present

## 2015-09-18 DIAGNOSIS — Z79899 Other long term (current) drug therapy: Secondary | ICD-10-CM | POA: Insufficient documentation

## 2015-09-18 DIAGNOSIS — F411 Generalized anxiety disorder: Secondary | ICD-10-CM | POA: Diagnosis not present

## 2015-09-18 DIAGNOSIS — T8484XA Pain due to internal orthopedic prosthetic devices, implants and grafts, initial encounter: Secondary | ICD-10-CM | POA: Insufficient documentation

## 2015-09-18 HISTORY — PX: MINOR HARDWARE REMOVAL: SHX6474

## 2015-09-18 HISTORY — DX: Anxiety disorder, unspecified: F41.9

## 2015-09-18 SURGERY — MINOR HARDWARE REMOVAL
Anesthesia: General | Laterality: Left | Wound class: Clean

## 2015-09-18 MED ORDER — METOCLOPRAMIDE HCL 5 MG/ML IJ SOLN: 5.0000 mg | Freq: Three times a day (TID) | INTRAMUSCULAR | Status: DC | PRN

## 2015-09-18 MED ORDER — ACETAMINOPHEN 10 MG/ML IV SOLN
INTRAVENOUS | Status: AC
  Filled 2015-09-18: qty 100

## 2015-09-18 MED ORDER — NEOMYCIN-POLYMYXIN B GU 40-200000 IR SOLN
Status: AC
  Filled 2015-09-18: qty 2

## 2015-09-18 MED ORDER — METOCLOPRAMIDE HCL 10 MG PO TABS: 5.0000 mg | ORAL_TABLET | Freq: Three times a day (TID) | ORAL | Status: DC | PRN

## 2015-09-18 MED ORDER — DEXAMETHASONE SODIUM PHOSPHATE 10 MG/ML IJ SOLN
INTRAMUSCULAR | Status: DC | PRN
  Administered 2015-09-18: 10 mg via INTRAVENOUS

## 2015-09-18 MED ORDER — PROPOFOL 10 MG/ML IV BOLUS
INTRAVENOUS | Status: DC | PRN
Start: 2015-09-18 — End: 2015-09-18
  Administered 2015-09-18: 150 mg via INTRAVENOUS

## 2015-09-18 MED ORDER — PHENYLEPHRINE HCL 10 MG/ML IJ SOLN
INTRAMUSCULAR | Status: DC | PRN
  Administered 2015-09-18: 100 ug via INTRAVENOUS

## 2015-09-18 MED ORDER — FENTANYL CITRATE (PF) 100 MCG/2ML IJ SOLN
INTRAMUSCULAR | Status: AC
  Administered 2015-09-18: 25 ug via INTRAVENOUS
  Filled 2015-09-18: qty 2

## 2015-09-18 MED ORDER — ONDANSETRON HCL 4 MG/2ML IJ SOLN
INTRAMUSCULAR | Status: DC | PRN
  Administered 2015-09-18: 4 mg via INTRAVENOUS

## 2015-09-18 MED ORDER — NEOMYCIN-POLYMYXIN B GU 40-200000 IR SOLN
Status: DC | PRN
  Administered 2015-09-18: .5 mL

## 2015-09-18 MED ORDER — FENTANYL CITRATE (PF) 100 MCG/2ML IJ SOLN
INTRAMUSCULAR | Status: DC | PRN
  Administered 2015-09-18 (×4): 50 ug via INTRAVENOUS

## 2015-09-18 MED ORDER — HYDROCODONE-ACETAMINOPHEN 5-325 MG PO TABS
ORAL_TABLET | ORAL | Status: AC
  Filled 2015-09-18: qty 1

## 2015-09-18 MED ORDER — CEFAZOLIN SODIUM 1-5 GM-% IV SOLN
INTRAVENOUS | Status: AC
  Filled 2015-09-18: qty 50

## 2015-09-18 MED ORDER — LACTATED RINGERS IV SOLN
INTRAVENOUS | Status: DC
  Administered 2015-09-18: 11:00:00 via INTRAVENOUS

## 2015-09-18 MED ORDER — FAMOTIDINE 20 MG PO TABS
ORAL_TABLET | ORAL | Status: AC
  Administered 2015-09-18: 20 mg via ORAL
  Filled 2015-09-18: qty 1

## 2015-09-18 MED ORDER — ONDANSETRON HCL 4 MG/2ML IJ SOLN: 4.0000 mg | Freq: Once | INTRAMUSCULAR | Status: DC | PRN

## 2015-09-18 MED ORDER — GLYCOPYRROLATE 0.2 MG/ML IJ SOLN
INTRAMUSCULAR | Status: DC | PRN
  Administered 2015-09-18: 0.2 mg via INTRAVENOUS

## 2015-09-18 MED ORDER — ONDANSETRON HCL 4 MG/2ML IJ SOLN: 4.0000 mg | Freq: Four times a day (QID) | INTRAMUSCULAR | Status: DC | PRN

## 2015-09-18 MED ORDER — HYDROCODONE-ACETAMINOPHEN 5-325 MG PO TABS: 1.0000 | ORAL_TABLET | Freq: Four times a day (QID) | ORAL | Status: AC | PRN

## 2015-09-18 MED ORDER — ONDANSETRON HCL 4 MG PO TABS: 4.0000 mg | ORAL_TABLET | Freq: Four times a day (QID) | ORAL | Status: DC | PRN

## 2015-09-18 MED ORDER — MIDAZOLAM HCL 5 MG/5ML IJ SOLN
INTRAMUSCULAR | Status: DC | PRN
  Administered 2015-09-18: 2 mg via INTRAVENOUS

## 2015-09-18 MED ORDER — HYDROCODONE-ACETAMINOPHEN 5-325 MG PO TABS
1.0000 | ORAL_TABLET | ORAL | Status: DC | PRN
  Administered 2015-09-18: 1 via ORAL

## 2015-09-18 MED ORDER — SODIUM CHLORIDE 0.9 % IV SOLN: INTRAVENOUS | Status: DC

## 2015-09-18 MED ORDER — FENTANYL CITRATE (PF) 100 MCG/2ML IJ SOLN
25.0000 ug | INTRAMUSCULAR | Status: AC | PRN
  Administered 2015-09-18 (×6): 25 ug via INTRAVENOUS

## 2015-09-18 MED ORDER — FAMOTIDINE 20 MG PO TABS
20.0000 mg | ORAL_TABLET | Freq: Once | ORAL | Status: AC
  Administered 2015-09-18: 20 mg via ORAL

## 2015-09-18 MED ORDER — CEFAZOLIN SODIUM 1-5 GM-% IV SOLN
1.0000 g | Freq: Once | INTRAVENOUS | Status: AC
  Administered 2015-09-18: 1 g via INTRAVENOUS

## 2015-09-18 MED ORDER — ACETAMINOPHEN 10 MG/ML IV SOLN
INTRAVENOUS | Status: DC | PRN
  Administered 2015-09-18: 1000 mg via INTRAVENOUS

## 2015-09-18 SURGICAL SUPPLY — 32 items
BANDAGE ACE 3X5.8 VEL STRL LF (GAUZE/BANDAGES/DRESSINGS) ×3 IMPLANT
BANDAGE ACE 4X5 VEL STRL LF (GAUZE/BANDAGES/DRESSINGS) ×3 IMPLANT
CANISTER SUCT 1200ML W/VALVE (MISCELLANEOUS) ×3 IMPLANT
CAST PADDING 3X4FT ST 30246 (SOFTGOODS) ×2
CHLORAPREP W/TINT 26ML (MISCELLANEOUS) ×3 IMPLANT
DRAPE FLUOR MINI C-ARM 54X84 (DRAPES) ×3 IMPLANT
ELECT REM PT RETURN 9FT ADLT (ELECTROSURGICAL) ×3
ELECTRODE REM PT RTRN 9FT ADLT (ELECTROSURGICAL) ×1 IMPLANT
GAUZE PETRO XEROFOAM 1X8 (MISCELLANEOUS) ×3 IMPLANT
GAUZE SPONGE 4X4 12PLY STRL (GAUZE/BANDAGES/DRESSINGS) ×3 IMPLANT
GAUZE XEROFORM 4X4 STRL (GAUZE/BANDAGES/DRESSINGS) ×3 IMPLANT
GLOVE BIOGEL PI IND STRL 9 (GLOVE) ×2 IMPLANT
GLOVE BIOGEL PI INDICATOR 9 (GLOVE) ×4
GLOVE SURG ORTHO 9.0 STRL STRW (GLOVE) ×6 IMPLANT
GOWN SPECIALTY ULTRA XL (MISCELLANEOUS) ×3 IMPLANT
GOWN STRL REUS W/ TWL LRG LVL3 (GOWN DISPOSABLE) ×1 IMPLANT
GOWN STRL REUS W/TWL LRG LVL3 (GOWN DISPOSABLE) ×2
KIT RM TURNOVER STRD PROC AR (KITS) ×3 IMPLANT
NEEDLE FILTER BLUNT 18X 1/2SAF (NEEDLE) ×2
NEEDLE FILTER BLUNT 18X1 1/2 (NEEDLE) ×1 IMPLANT
NS IRRIG 500ML POUR BTL (IV SOLUTION) ×3 IMPLANT
PACK EXTREMITY ARMC (MISCELLANEOUS) ×3 IMPLANT
PAD CAST CTTN 3X4 STRL (SOFTGOODS) ×1 IMPLANT
PAD CAST CTTN 4X4 STRL (SOFTGOODS) ×1 IMPLANT
PADDING CAST COTTON 4X4 STRL (SOFTGOODS) ×2
SPLINT CAST 1 STEP 3X12 (MISCELLANEOUS) IMPLANT
STOCKINETTE STRL 4IN 9604848 (GAUZE/BANDAGES/DRESSINGS) ×3 IMPLANT
SUT ETHILON 4-0 (SUTURE) ×2
SUT ETHILON 4-0 FS2 18XMFL BLK (SUTURE) ×1
SUT VICRYL 3-0 27IN (SUTURE) ×3 IMPLANT
SUTURE ETHLN 4-0 FS2 18XMF BLK (SUTURE) ×1 IMPLANT
SYR 3ML LL SCALE MARK (SYRINGE) ×3 IMPLANT

## 2015-09-18 NOTE — H&P (Signed)
Reviewed paper H+P, will be scanned into chart. No changes noted.  

## 2015-09-18 NOTE — Op Note (Signed)
09/18/2015  12:34 PM  PATIENT:  Shirley Rose  55 y.o. female  PRE-OPERATIVE DIAGNOSIS:  PO ORIF LEFT WRIST FRACTURE  POST-OPERATIVE DIAGNOSIS:  PAINFUL HARDWARE  PROCEDURE:  Procedure(s): LEFT WRIST HARDWARE REMOVAL, DISTAL RADIUS/ULNA JOINT DEBRIDEMENT  (Left)  SURGEON: Leitha SchullerMichael J Namari Breton, MD  ASSISTANTS: None  ANESTHESIA:   general  EBL:  Total I/O In: 500 [I.V.:500] Out: 10 [Blood:10]  BLOOD ADMINISTERED:none  DRAINS: none   LOCAL MEDICATIONS USED:  NONE  SPECIMEN:  No Specimen  DISPOSITION OF SPECIMEN:  N/A  COUNTS:  YES  TOURNIQUET:  * No tourniquets in log *  IMPLANTS: None  DICTATION: .Dragon Dictation patient brought the operating room and after adequate anesthesia was obtained left arm was prepped and draped in sterile fashion. After patient identification and timeout procedure was completed having prepped and draped the arm tourniquet was raised. A volar approach is made over the FCR tendon through the prior incision. The FCR tendon was retracted radially and the plate exposed without difficulty plate was removed and following this the volar aspect of the DRUJ was opened and a freer elevator entered following this with there was full pronation supination. There is a scar tissue limiting motion and after release volarly there is full pronation supination present at the start of the case third been 30 supination degrees pronation. The wound was then irrigated and tourniquet let down and there is no significant bleeding the wound was closed with 3-0 Vicryl subcutaneously and 4-0 nylon for the skin. Xeroform 4 x 4 web roll and Ace wrap applied. Patient center comes stable condition hardware is to be clean and set with patient if she desired a problem with the hardware was all intact without loosening. Fracture was healed  PLAN OF CARE: Discharge to home after PACU  PATIENT DISPOSITION:  PACU - hemodynamically stable.

## 2015-09-18 NOTE — Anesthesia Preprocedure Evaluation (Signed)
Anesthesia Evaluation  Patient identified by MRN, date of birth, ID band Patient awake    Reviewed: Allergy & Precautions, H&P , NPO status , Patient's Chart, lab work & pertinent test results, reviewed documented beta blocker date and time   Airway Mallampati: II  TM Distance: >3 FB Neck ROM: full    Dental  (+) Teeth Intact   Pulmonary neg pulmonary ROS,    Pulmonary exam normal        Cardiovascular Exercise Tolerance: Good negative cardio ROS Normal cardiovascular exam Rate:Normal     Neuro/Psych negative neurological ROS  negative psych ROS   GI/Hepatic negative GI ROS, Neg liver ROS,   Endo/Other  negative endocrine ROS  Renal/GU negative Renal ROS  negative genitourinary   Musculoskeletal   Abdominal   Peds  Hematology negative hematology ROS (+)   Anesthesia Other Findings   Reproductive/Obstetrics negative OB ROS                             Anesthesia Physical Anesthesia Plan  ASA: II  Anesthesia Plan: General LMA   Post-op Pain Management:    Induction:   Airway Management Planned:   Additional Equipment:   Intra-op Plan:   Post-operative Plan:   Informed Consent: I have reviewed the patients History and Physical, chart, labs and discussed the procedure including the risks, benefits and alternatives for the proposed anesthesia with the patient or authorized representative who has indicated his/her understanding and acceptance.     Plan Discussed with: CRNA  Anesthesia Plan Comments:         Anesthesia Quick Evaluation  

## 2015-09-18 NOTE — Anesthesia Procedure Notes (Signed)
Procedure Name: LMA Insertion Performed by: Kearstyn Avitia Pre-anesthesia Checklist: Patient identified, Patient being monitored, Timeout performed, Emergency Drugs available and Suction available Patient Re-evaluated:Patient Re-evaluated prior to inductionOxygen Delivery Method: Circle system utilized Preoxygenation: Pre-oxygenation with 100% oxygen Intubation Type: IV induction Ventilation: Mask ventilation without difficulty LMA: LMA inserted LMA Size: 3.5 Tube type: Oral Number of attempts: 1 Placement Confirmation: positive ETCO2 and breath sounds checked- equal and bilateral Tube secured with: Tape Dental Injury: Teeth and Oropharynx as per pre-operative assessment        

## 2015-09-18 NOTE — Transfer of Care (Addendum)
Immediate Anesthesia Transfer of Care Note  Patient: Shirley CordialLinda M Rose  Procedure(s) Performed: Procedure(s): LEFT WRIST HARDWARE REMOVAL, DISTAL RADIUS/ULNA JOINT DEBRIDEMENT  (Left)  Patient Location: PACU  Anesthesia Type:General  Level of Consciousness: awake, alert , oriented and patient cooperative  Airway & Oxygen Therapy: Patient Spontanous Breathing and Patient connected to nasal cannula oxygen  Post-op Assessment: Report given to RN, Post -op Vital signs reviewed and stable and Patient moving all extremities X 4  Post vital signs: Reviewed and stable  Last Vitals:  Filed Vitals:   09/18/15 1043 09/18/15 1236  BP: 103/68 117/72  Pulse: 75 87  Temp: 36.7 C 36.1 C  Resp: 16 25    Complications: No apparent anesthesia complications

## 2015-09-18 NOTE — Discharge Instructions (Signed)
Loosen Ace wrap if fingers swell.   Keep dressing clean and dry.    Work on range of motion as much as possible.  AMBULATORY SURGERY  DISCHARGE INSTRUCTIONS   1) The drugs that you were given will stay in your system until tomorrow so for the next 24 hours you should not:  A) Drive an automobile B) Make any legal decisions C) Drink any alcoholic beverage   2) You may resume regular meals tomorrow.  Today it is better to start with liquids and gradually work up to solid foods.  You may eat anything you prefer, but it is better to start with liquids, then soup and crackers, and gradually work up to solid foods.   3) Please notify your doctor immediately if you have any unusual bleeding, trouble breathing, redness and pain at the surgery site, drainage, fever, or pain not relieved by medication.    4) Additional Instructions:        Please contact your physician with any problems or Same Day Surgery at [phone], Monday through Friday 6 am to 4 pm, or [place] at [facility] Main number at [phone].

## 2015-09-19 NOTE — Anesthesia Postprocedure Evaluation (Signed)
Anesthesia Post Note  Patient: Olen CordialLinda M Grissom  Procedure(s) Performed: Procedure(s) (LRB): LEFT WRIST HARDWARE REMOVAL, DISTAL RADIUS/ULNA JOINT DEBRIDEMENT  (Left)  Patient location during evaluation: PACU Anesthesia Type: General Level of consciousness: awake and alert Pain management: pain level controlled Vital Signs Assessment: post-procedure vital signs reviewed and stable Respiratory status: spontaneous breathing, nonlabored ventilation, respiratory function stable and patient connected to nasal cannula oxygen Cardiovascular status: blood pressure returned to baseline and stable Postop Assessment: no signs of nausea or vomiting Anesthetic complications: no    Last Vitals:  Filed Vitals:   09/18/15 1400 09/18/15 1443  BP: 102/57 98/60  Pulse: 64 65  Temp:    Resp: 16 16    Last Pain:  Filed Vitals:   09/19/15 0856  PainSc: 0-No pain                 Yevette EdwardsJames G Adams

## 2015-09-26 ENCOUNTER — Ambulatory Visit: Payer: BLUE CROSS/BLUE SHIELD | Attending: Orthopedic Surgery | Admitting: Occupational Therapy

## 2015-09-26 DIAGNOSIS — M25632 Stiffness of left wrist, not elsewhere classified: Secondary | ICD-10-CM | POA: Diagnosis present

## 2015-09-26 DIAGNOSIS — M25631 Stiffness of right wrist, not elsewhere classified: Secondary | ICD-10-CM | POA: Diagnosis present

## 2015-09-26 DIAGNOSIS — M79632 Pain in left forearm: Secondary | ICD-10-CM | POA: Diagnosis present

## 2015-09-26 DIAGNOSIS — M6281 Muscle weakness (generalized): Secondary | ICD-10-CM | POA: Insufficient documentation

## 2015-09-26 NOTE — Patient Instructions (Signed)
PROM of supination - after heat  Prolonged stretch  Then AROM and AAROM for pronation and supination   Prayer stretch for wrist ext  And flexion wrist PROM stretch  Followed with some AROM wrist flexion and ext

## 2015-09-26 NOTE — Therapy (Signed)
[facility] [address] [place], [address] Phone: [phone]   Fax:  [phone]  Occupational Therapy Treatment  Patient Details  Name: Shirley Rose MRN: [id number] Date of Birth: 1961-03-27 Referring Provider: Rosita Kea  Encounter Date: 09/26/2015      OT End of Session - 09/26/15 1415    Visit Number 1   Number of Visits 8   Date for OT Re-Evaluation 10/24/15   OT Start Time 0845   OT Stop Time 0930   OT Time Calculation (min) 45 min   Activity Tolerance Patient tolerated treatment well;Patient limited by pain   Behavior During Therapy [facility] for tasks assessed/performed      Past Medical History  Diagnosis Date  . Anxiety     Past Surgical History  Procedure Laterality Date  . Orif wrist fracture Bilateral 06/30/2015    Procedure: OPEN REDUCTION INTERNAL FIXATION (ORIF) WRIST FRACTURE;  Surgeon: Kennedy Bucker, MD;  Location: [facility] ORS;  Service: Orthopedics;  Laterality: Bilateral;  . Tubal ligation    . Minor hardware removal Left 09/18/2015    Procedure: LEFT WRIST HARDWARE REMOVAL, DISTAL RADIUS/ULNA JOINT DEBRIDEMENT ;  Surgeon: Kennedy Bucker, MD;  Location: [facility] ORS;  Service: Orthopedics;  Laterality: Left;    There were no vitals filed for this visit.      Subjective Assessment - 09/26/15 0847    Subjective  I am back to work - 4 hrs of typing- pain on the ulnar side of hand - can lift now - dry my hair - stitches still in - pain better    Patient Stated Goals Want to get my Range of motion better  palm up and up and down -  washing my hair , put on lotions , cooking  - want to get my palm up and better with that - hand things     Currently in Pain? Yes   Pain Score 1    Pain Location Wrist   Pain Orientation Left            [facility] OT Assessment - 09/26/15 0001    Assessment   Diagnosis L wrist hardware reomval and debridement    Referring Provider menz   Onset Date 09/18/15    Precautions   Precaution Comments no pushups    Balance Screen   Has the patient fallen in the past 6 months Yes   How many times? 1   Has the patient had a decrease in activity level because of a fear of falling?  Yes   Is the patient reluctant to leave their home because of a fear of falling?  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 [employer] - pt is R hand dominant  likes to play with grand children - 2 ,4 ,6 and 8 yrs old , cook , house work , reading tablet    AROM   Left Forearm Pronation 55 Degrees   Left Forearm Supination 30 Degrees   Right Wrist Extension 44 Degrees   Right Wrist Flexion 77 Degrees   Right Wrist Radial Deviation 12 Degrees   Right Wrist Ulnar Deviation 25 Degrees   Left Wrist Extension 44 Degrees   Left Wrist Flexion 66 Degrees   Left Wrist Radial Deviation 13 Degrees   Left Wrist Ulnar  Deviation 30 Degrees   Strength   Right Hand Grip (lbs) 32   Right Hand Lateral Pinch 12 lbs   Right Hand 3 Point Pinch 14 lbs   Left Hand Grip (lbs) NT        ed on HEP for ROM - see pt instruction                   OT Education - 09/26/15 1415    Education provided Yes   Education Details HEP   Person(s) Educated Patient   Methods Explanation;Demonstration;Tactile cues;Verbal cues   Comprehension Verbal cues required;Returned demonstration;Verbalized understanding          OT Short Term Goals - 09/26/15 1425    OT SHORT TERM GOAL #1   Title Wrist supination and pronation on L wrist improve to WNL to  turn doorknob    Baseline sup 30; pronation 55   Time 3   Period Weeks   Status New   OT SHORT TERM GOAL #2   Title Pt to be in in HEP to improve wrist exention to 60 and sup/pro WNL  to use in bathing and dressing    Baseline wrsit extention 44   Time 3   Period Weeks   Status New           OT Long Term  Goals - 09/26/15 1427    OT LONG TERM GOAL #1   Title Pain in L wrist improve on PRWHE by 8 points    Baseline  Pain on PRWHE 11/50    Time 4   Period Weeks   Status New   OT LONG TERM GOAL #2   Title Function on PRWHE improve with 15 points using L hand and wrist    Baseline Function on PRWHE 25/50    Time 4   Period Weeks   Status New               Plan - 09/26/15 1416    Clinical Impression Statement Pt present this date  week out from having hardware remove  from L wrist ORIF done in January - pt report pain much better - but still lmiited in ROM - did not do to much until she seen me - pt still cont to show decrease supination - but prronation got worse too - as welll as wrist flexion and extention - pt  was ed on HEP - stitches still in - follow up appt with MD on 12 April    Rehab Potential Excellent   OT Frequency 2x / week   OT Duration 4 weeks   OT Treatment/Interventions Self-care/ADL training;Parrafin;Fluidtherapy;Patient/family education;Therapeutic exercises;Scar mobilization;Passive range of motion;Manual Therapy   Plan assess progress , scar management initiated if indicated    OT Home Exercise Plan see pt instruciton    Consulted and Agree with Plan of Care Patient      Patient will benefit from skilled therapeutic intervention in order to improve the following deficits and impairments:  Decreased range of motion, Impaired flexibility, Decreased knowledge of precautions, Decreased scar mobility, Impaired UE functional use, Pain, Decreased strength  Visit Diagnosis: Stiffness of left wrist joint  Muscle weakness  Pain of left forearm    Problem List Patient Active Problem List   Diagnosis Date Noted  . Wrist fracture, bilateral 06/29/2015    Oletta CohnuPreez, Shaima Sardinas OTR/L,CLT  09/26/2015, 2:30 PM  [facility] [facility] [address] [place], [place], [id number] Phone:  [phone]   Fax:   [phone]  Name: Shirley Rose MRN: [id number] Date of Birth: 1961-03-02

## 2015-09-29 ENCOUNTER — Emergency Department: Payer: BLUE CROSS/BLUE SHIELD

## 2015-09-29 ENCOUNTER — Emergency Department
Admission: EM | Admit: 2015-09-29 | Discharge: 2015-09-29 | Disposition: A | Discharge status: Home / Self Care | Payer: BLUE CROSS/BLUE SHIELD | Attending: Emergency Medicine | Admitting: Emergency Medicine

## 2015-09-29 DIAGNOSIS — Y939 Activity, unspecified: Secondary | ICD-10-CM | POA: Diagnosis not present

## 2015-09-29 DIAGNOSIS — Y929 Unspecified place or not applicable: Secondary | ICD-10-CM | POA: Insufficient documentation

## 2015-09-29 DIAGNOSIS — S93402A Sprain of unspecified ligament of left ankle, initial encounter: Secondary | ICD-10-CM | POA: Insufficient documentation

## 2015-09-29 DIAGNOSIS — Z79899 Other long term (current) drug therapy: Secondary | ICD-10-CM | POA: Diagnosis not present

## 2015-09-29 DIAGNOSIS — Y999 Unspecified external cause status: Secondary | ICD-10-CM | POA: Insufficient documentation

## 2015-09-29 DIAGNOSIS — M25572 Pain in left ankle and joints of left foot: Secondary | ICD-10-CM | POA: Diagnosis present

## 2015-09-29 DIAGNOSIS — W010XXA Fall on same level from slipping, tripping and stumbling without subsequent striking against object, initial encounter: Secondary | ICD-10-CM | POA: Diagnosis not present

## 2015-09-29 NOTE — ED Notes (Signed)
Pt states she tripped and fell yesterday and is having left ankle pain. Pt has her own crutches with her on arrival

## 2015-09-29 NOTE — Discharge Instructions (Signed)
Ankle Sprain °An ankle sprain is an injury to the strong, fibrous tissues (ligaments) that hold your ankle bones together.  °HOME CARE  °· Put ice on your ankle for 1-2 days or as told by your doctor. °· Put ice in a plastic bag. °· Place a towel between your skin and the bag. °· Leave the ice on for 15-20 minutes at a time, every 2 hours while you are awake. °· Only take medicine as told by your doctor. °· Raise (elevate) your injured ankle above the level of your heart as much as possible for 2-3 days. °· Use crutches if your doctor tells you to. Slowly put your own weight on the affected ankle. Use the crutches until you can walk without pain. °· If you have a plaster splint: °· Do not rest it on anything harder than a pillow for 24 hours. °· Do not put weight on it. °· Do not get it wet. °· Take it off to shower or bathe. °· If given, use an elastic wrap or support stocking for support. Take the wrap off if your toes lose feeling (numb), tingle, or turn cold or blue. °· If you have an air splint: °· Add or let out air to make it comfortable. °· Take it off at night and to shower and bathe. °· Wiggle your toes and move your ankle up and down often while you are wearing it. °GET HELP IF: °· You have rapidly increasing bruising or puffiness (swelling). °· Your toes feel very cold. °· You lose feeling in your foot. °· Your medicine does not help your pain. °GET HELP RIGHT AWAY IF:  °· Your toes lose feeling (numb) or turn blue. °· You have severe pain that is increasing. °MAKE SURE YOU:  °· Understand these instructions. °· Will watch your condition. °· Will get help right away if you are not doing well or get worse. °  °This information is not intended to replace advice given to you by your health care provider. Make sure you discuss any questions you have with your health care provider. °  °Document Released: 11/24/2007 Document Revised: 06/28/2014 Document Reviewed: 12/20/2011 °Elsevier Interactive Patient  Education ©2016 Elsevier Inc. ° °Cryotherapy °Cryotherapy is when you put ice on your injury. Ice helps lessen pain and puffiness (swelling) after an injury. Ice works the best when you start using it in the first 24 to 48 hours after an injury. °HOME CARE °· Put a dry or damp towel between the ice pack and your skin. °· You may press gently on the ice pack. °· Leave the ice on for no more than 10 to 20 minutes at a time. °· Check your skin after 5 minutes to make sure your skin is okay. °· Rest at least 20 minutes between ice pack uses. °· Stop using ice when your skin loses feeling (numbness). °· Do not use ice on someone who cannot tell you when it hurts. This includes small children and people with memory problems (dementia). °GET HELP RIGHT AWAY IF: °· You have white spots on your skin. °· Your skin turns blue or pale. °· Your skin feels waxy or hard. °· Your puffiness gets worse. °MAKE SURE YOU:  °· Understand these instructions. °· Will watch your condition. °· Will get help right away if you are not doing well or get worse. °  °This information is not intended to replace advice given to you by your health care provider. Make sure you discuss any   questions you have with your health care provider.   Document Released: 11/24/2007 Document Revised: 08/30/2011 Document Reviewed: 01/28/2011 Elsevier Interactive Patient Education [employer]2016 Elsevier Inc.   Follow-up with Dr. Rosita KeaMenz if any continued problems. Ice and elevate as needed for swelling and pain. Wear ankle splint for support and protection. Continue taking her Vicodin at home as needed for pain. He may also take ibuprofen for pain and inflammation.

## 2015-09-29 NOTE — ED Provider Notes (Signed)
[facility] Emergency Department Provider Note  ____________________________________________  Time seen: Approximately 9:14 AM  I have reviewed the triage vital signs and the nursing notes.   HISTORY  Chief Complaint Ankle Pain   HPI Olen CordialLinda M Fread is a 55 y.o. female is here with complaint of left ankle pain. Patient states she tripped and fell yesterday and has continued to have ankle pain and swelling since. She denies any head injury or loss consciousness during this time. She comes to the emergency room on her own crutches but is having difficulty walking with them as she just recently had surgery on her left wrist to remove hardware from a open reduction of a wrist fracture in January.   Past Medical History  Diagnosis Date  . Anxiety     Patient Active Problem List   Diagnosis Date Noted  . Wrist fracture, bilateral 06/29/2015    Past Surgical History  Procedure Laterality Date  . Orif wrist fracture Bilateral 06/30/2015    Procedure: OPEN REDUCTION INTERNAL FIXATION (ORIF) WRIST FRACTURE;  Surgeon: Kennedy BuckerMichael Menz, MD;  Location: [facility] ORS;  Service: Orthopedics;  Laterality: Bilateral;  . Tubal ligation    . Minor hardware removal Left 09/18/2015    Procedure: LEFT WRIST HARDWARE REMOVAL, DISTAL RADIUS/ULNA JOINT DEBRIDEMENT ;  Surgeon: Kennedy BuckerMichael Menz, MD;  Location: [facility] ORS;  Service: Orthopedics;  Laterality: Left;    Current Outpatient Rx  Name  Route  Sig  Dispense  Refill  . buPROPion (WELLBUTRIN XL) 150 MG 24 hr tablet   Oral   Take 150 mg by mouth 2 (two) times daily.          Marland Kitchen. HYDROcodone-acetaminophen (NORCO) 5-325 MG tablet   Oral   Take 1 tablet by mouth every 6 (six) hours as needed for moderate pain.   25 tablet   0   . Multiple Vitamin (MULTIVITAMIN) tablet   Oral   Take 1 tablet by mouth daily.         . pimecrolimus (ELIDEL) 1 % cream   Topical   Apply 1 application topically daily as needed. For rash. Apply to  affected areas on the face.         . RESTASIS 0.05 % ophthalmic emulsion   Both Eyes   Place 1 drop into both eyes 2 (two) times daily.      5     Dispense as written.   . zolpidem (AMBIEN CR) 6.25 MG CR tablet   Oral   Take 6.25 mg by mouth at bedtime as needed. For sleep.      5     Allergies Sulfa antibiotics  No family history on file.  Social History Social History  Substance Use Topics  . Smoking status: Never Smoker   . Smokeless tobacco: None  . Alcohol Use: No    Review of Systems Constitutional: No fever/chills Cardiovascular: Denies chest pain. Respiratory: Denies shortness of breath. Musculoskeletal: Negative for back pain.Positive left ankle pain. Skin: Negative for rash. Neurological: Negative for headaches, focal weakness or numbness.  10-point ROS otherwise negative.  ____________________________________________   PHYSICAL EXAM:  VITAL SIGNS: ED Triage Vitals  Enc Vitals Group     BP 09/29/15 0856 98/58 mmHg     Pulse --      Resp 09/29/15 0856 20     Temp 09/29/15 0856 98.1 F (36.7 C)     Temp Source 09/29/15 0856 Oral     SpO2 --  Weight 09/29/15 0856 125 lb (56.7 kg)     Height 09/29/15 0856  (1.6 m)     Head Cir --      Peak Flow --      Pain Score --      Pain Loc --      Pain Edu? --      Excl. in GC? --     Constitutional: Alert and oriented. Well appearing and in no acute distress. Eyes: Conjunctivae are normal. PERRL. EOMI. Head: Atraumatic. Nose: No congestion/rhinnorhea. Neck: No stridor.   Cardiovascular: Normal rate, regular rhythm. Grossly normal heart sounds.  Good peripheral circulation. Respiratory: Normal respiratory effort.  No retractions. Lungs CTAB. Musculoskeletal: Examination of the left ankle there is no gross deformity noted. There is moderate tenderness on palpation of the lateral aspect with some soft tissue swelling but no deformity. Range of motion is restricted secondary to patient's  pain. Pulses positive. Patient is able to move digits distal to the injury. Weight-bearing was not tested secondary to patient's pain. Neurologic:  Normal speech and language. No gross focal neurologic deficits are appreciated.   Skin:  Skin is warm, dry and intact. No ecchymosis or abrasions were noted. Psychiatric: Mood and affect are normal. Speech and behavior are normal.  ____________________________________________   LABS (all labs ordered are listed, but only abnormal results are displayed)  Labs Reviewed - No data to display   RADIOLOGY  X-ray of left ankle there is soft tissue swelling present but no acute fracture seen per radiologist. ____________________________________________   PROCEDURES  Procedure(s) performed: None  Critical Care performed: No  ____________________________________________   INITIAL IMPRESSION / ASSESSMENT AND PLAN / ED COURSE  Pertinent labs & imaging results that were available during my care of the patient were reviewed by me and considered in my medical decision making (see chart for details).  Patient was placed in a ankle stirrup splint. She is to use her crutches that she has with her. Patient states that using the crutches is aggravating her wrist where she had surgery and requested a ortho boot to be placed on her ankle. When both provider and nurse explained to her that those were not stocked in the emergency room and can be obtained from a podiatrist or orthopedist, patient was slightly disgruntled. Nursing staff did call the orthopedic office and was advised that if the patient went to the acute walk in section of [facility] that she would be fitted with a boot. ____________________________________________   FINAL CLINICAL IMPRESSION(S) / ED DIAGNOSES  Final diagnoses:  Sprain of left ankle, initial encounter      Tommi Rumps, PA-C 09/29/15 1421  Rockne Menghini, MD 09/29/15 [phone]

## 2015-10-02 ENCOUNTER — Ambulatory Visit: Payer: BLUE CROSS/BLUE SHIELD | Admitting: Occupational Therapy

## 2015-10-02 DIAGNOSIS — M79632 Pain in left forearm: Secondary | ICD-10-CM

## 2015-10-02 DIAGNOSIS — M6281 Muscle weakness (generalized): Secondary | ICD-10-CM

## 2015-10-02 DIAGNOSIS — M25632 Stiffness of left wrist, not elsewhere classified: Secondary | ICD-10-CM

## 2015-10-02 NOTE — Patient Instructions (Signed)
  Add PROM for RD  And Wrist extention - on table or prayer stretch  Can do flexion PROM to R wrist - did improve greatly during session   1 lbs weight for wrist in all planes and done - had some pain with supination - 10 reps - 2 x day

## 2015-10-02 NOTE — Therapy (Signed)
[facility] [facility] [facility] [address] [place], [place], [address] Phone: [phone]   Fax:  [phone]  Occupational Therapy Treatment  Patient Details  Name: Shirley Rose MRN: [id number] Date of Birth: 04/02/1961 Referring Provider: Rosita Keamenz  Encounter Date: 10/02/2015      OT End of Session - 10/02/15 1127    Visit Number 2   Number of Visits 8   Date for OT Re-Evaluation 10/24/15   OT Start Time 0935   OT Stop Time 1014   OT Time Calculation (min) 39 min   Activity Tolerance Patient tolerated treatment well;Patient limited by pain   Behavior [facility] for tasks assessed/performed      Past Medical History  Diagnosis Date  . Anxiety     Past Surgical History  Procedure Laterality Date  . Orif wrist fracture Bilateral 06/30/2015    Procedure: OPEN REDUCTION INTERNAL FIXATION (ORIF) WRIST FRACTURE;  Surgeon: Kennedy BuckerMichael Menz, MD;  Location: [facility] ORS;  Service: Orthopedics;  Laterality: Bilateral;  . Tubal ligation    . Minor hardware removal Left 09/18/2015    Procedure: LEFT WRIST HARDWARE REMOVAL, DISTAL RADIUS/ULNA JOINT DEBRIDEMENT ;  Surgeon: Kennedy BuckerMichael Menz, MD;  Location: [facility] ORS;  Service: Orthopedics;  Laterality: Left;    There were no vitals filed for this visit.      Subjective Assessment - 10/02/15 1122    Subjective  Seen DR Rosita KeaMenz yesterday and they  took stitches out - doing better - I can tell difference and pain is better    Patient Stated Goals Want to get my Range of motion better  palm up and up and down -  washing my hair , put on lotions , cooking  - want to get my palm up and better with that - hand things     Currently in Pain? No/denies                      OT Treatments/Exercises (OP) - 10/02/15 0001    Moist Heat Therapy   Number Minutes Moist Heat 10 Minutes   Moist Heat Location Wrist      [facility]   PROM for supination   Followed by AAROM and AROM for supination and pronation - increase to 55-60 degrees  Add PROM for RD  And Wrist extention - on table or prayer stretch  Can do flexion PROM to R wrist - did improve greatly during session   1 lbs weight for wrist in all planes and done - had some pain with supination - 10 reps - 2 x day            OT Education - 10/02/15 1127    Education provided Yes   Education Details HEP   Person(s) Educated Patient   Methods Explanation;Demonstration;Tactile cues;Verbal cues   Comprehension Verbal cues required;Returned demonstration;Verbalized understanding          OT Short Term Goals - 09/26/15 1425    OT SHORT TERM GOAL #1   Title Wrist supination and pronation on L wrist improve to WNL to  turn doorknob    Baseline sup 30; pronation 55   Time 3   Period Weeks   Status New   OT SHORT TERM GOAL #2   Title Pt to be in in HEP to improve wrist exention to 60 and sup/pro WNL  to use in bathing and dressing  Baseline wrsit extention 44   Time 3   Period Weeks   Status New           OT Long Term Goals - 09/26/15 1427    OT LONG TERM GOAL #1   Title Pain in L wrist improve on PRWHE by 8 points    Baseline  Pain on PRWHE 11/50    Time 4   Period Weeks   Status New   OT LONG TERM GOAL #2   Title Function on PRWHE improve with 15 points using L hand and wrist    Baseline Function on PRWHE 25/50    Time 4   Period Weeks   Status New               Plan - 10/02/15 1128    Clinical Impression Statement Pt present this date with increase supiantion and pronation compare to eval - pt sterri strips in place a- stitches come out yesterday - pt showed increase motion during session but add RD and wrist extention too for HEP and 1 lbs weight for  strengheting     Rehab Potential Excellent   OT Frequency 2x / week   OT Duration 4 weeks   OT Treatment/Interventions Self-care/ADL training;Parrafin;Fluidtherapy;Patient/family  education;Therapeutic exercises;Scar mobilization;Passive range of motion;Manual Therapy   Plan assess progress and upgrade HEP as tolerated   OT Home Exercise Plan see pt instruciton    Consulted and Agree with Plan of Care Patient      Patient will benefit from skilled therapeutic intervention in order to improve the following deficits and impairments:  Decreased range of motion, Impaired flexibility, Decreased knowledge of precautions, Decreased scar mobility, Impaired UE functional use, Pain, Decreased strength  Visit Diagnosis: Stiffness of left wrist joint  Muscle weakness  Pain of left forearm    Problem List Patient Active Problem List   Diagnosis Date Noted  . Wrist fracture, bilateral 06/29/2015    Oletta Cohn OTR/L,CLT  10/02/2015, 11:30 AM  [place] [address], [place], [address] Phone: [phone]   Fax:  [phone]  Name: Shirley Rose MRN: [id number] Date of Birth: 1960/09/05

## 2015-10-03 ENCOUNTER — Ambulatory Visit: Payer: BLUE CROSS/BLUE SHIELD | Admitting: Occupational Therapy

## 2015-10-06 ENCOUNTER — Ambulatory Visit: Payer: BLUE CROSS/BLUE SHIELD | Admitting: Occupational Therapy

## 2015-10-06 DIAGNOSIS — M25631 Stiffness of right wrist, not elsewhere classified: Secondary | ICD-10-CM

## 2015-10-06 DIAGNOSIS — M25632 Stiffness of left wrist, not elsewhere classified: Secondary | ICD-10-CM | POA: Diagnosis not present

## 2015-10-06 DIAGNOSIS — M6281 Muscle weakness (generalized): Secondary | ICD-10-CM

## 2015-10-06 DIAGNOSIS — M79632 Pain in left forearm: Secondary | ICD-10-CM

## 2015-10-06 NOTE — Patient Instructions (Signed)
Add to HEP  USed supination wheel  on pt's leg -  correct movement rotating out of forearm - pt did get it -     PROM for RD reviewed again , followed by AAROM on table with hand on paper to get increase RD   Wrist extention - on table for prayer stretch     1 lbs weight for wrist supination and pronation  10 reps

## 2015-10-06 NOTE — Therapy (Signed)
[facility] [address] [place], [address] Phone: [phone]   Fax:  [phone]  Occupational Therapy Treatment  Patient Details  Name: MINNAH LLAMAS MRN: [id number] Date of Birth: Jul 27, 1960 Referring Provider: Rosita Kea  Encounter Date: 10/06/2015      OT End of Session - 10/06/15 1450    Visit Number 3   Number of Visits 8   Date for OT Re-Evaluation 10/24/15   OT Start Time 1404   OT Stop Time 1445   OT Time Calculation (min) 41 min   Activity Tolerance Patient tolerated treatment well;Patient limited by pain   Behavior During Therapy [facility] for tasks assessed/performed      Past Medical History  Diagnosis Date  . Anxiety     Past Surgical History  Procedure Laterality Date  . Orif wrist fracture Bilateral 06/30/2015    Procedure: OPEN REDUCTION INTERNAL FIXATION (ORIF) WRIST FRACTURE;  Surgeon: Kennedy Bucker, MD;  Location: [facility] ORS;  Service: Orthopedics;  Laterality: Bilateral;  . Tubal ligation    . Minor hardware removal Left 09/18/2015    Procedure: LEFT WRIST HARDWARE REMOVAL, DISTAL RADIUS/ULNA JOINT DEBRIDEMENT ;  Surgeon: Kennedy Bucker, MD;  Location: [facility] ORS;  Service: Orthopedics;  Laterality: Left;    There were no vitals filed for this visit.      Subjective Assessment - 10/06/15 1402    Subjective  Typing is not as painfull - I used it more - but was just busy and just did not do as much because of easier    Patient Stated Goals Want to get my Range of motion better  palm up and up and down -  washing my hair , put on lotions , cooking  - want to get my palm up and better with that - hand things     Currently in Pain? No/denies                      OT Treatments/Exercises (OP) - 10/06/15 0001    LUE Paraffin   Number Minutes Paraffin 10 Minutes   LUE Paraffin Location Hand;Wrist   Comments with heatinpad in supination position to to increase ROM at [facility]        Paraffin to L wrist and hand with heatingpad around wrist with stretch into supination 10 min   sterri strips removed - 3 come loose - closed - pt to do scar massage  - adhere proximal more than distally   PROM for supination  Followed by AAROM and AROM for supination and pronation  USed supination wheel on table and then on pt's leg - pt first did more with wrist - but at end got correct movement rotating out of forearm     PROM for RD reviewed again , followed by AAROM on table with hand on paper to get increase RD   Wrist extention - on table for prayer stretch     1 lbs weight for wrist supination and pronation  10 reps            OT Education - 10/06/15 1450    Education provided Yes   Education Details HEP   Person(s) Educated Patient   Methods Explanation;Demonstration;Verbal cues   Comprehension Returned demonstration;Verbalized understanding          OT Short Term Goals - 09/26/15 1425    OT SHORT TERM GOAL #1   Title Wrist supination and pronation on L wrist  improve to WNL to  turn doorknob    Baseline sup 30; pronation 55   Time 3   Period Weeks   Status New   OT SHORT TERM GOAL #2   Title Pt to be in in HEP to improve wrist exention to 60 and sup/pro WNL  to use in bathing and dressing    Baseline wrsit extention 44   Time 3   Period Weeks   Status New           OT Long Term Goals - 09/26/15 1427    OT LONG TERM GOAL #1   Title Pain in L wrist improve on PRWHE by 8 points    Baseline  Pain on PRWHE 11/50    Time 4   Period Weeks   Status New   OT LONG TERM GOAL #2   Title Function on PRWHE improve with 15 points using L hand and wrist    Baseline Function on PRWHE 25/50    Time 4   Period Weeks   Status New               Plan - 10/06/15 1451    Clinical Impression Statement Pt this date got correct movement -rotation into supination out of forearm not wrist - pt to use supinatoin wheel at home - had less of burn on Ulnar side  of wrist    Rehab Potential Excellent   OT Frequency 2x / week   OT Duration 4 weeks   OT Treatment/Interventions Self-care/ADL training;Parrafin;Fluidtherapy;Patient/family education;Therapeutic exercises;Scar mobilization;Passive range of motion;Manual Therapy   Plan assess progress sup and RD    OT Home Exercise Plan see pt instruciton    Consulted and Agree with Plan of Care Patient      Patient will benefit from skilled therapeutic intervention in order to improve the following deficits and impairments:  Decreased range of motion, Impaired flexibility, Decreased knowledge of precautions, Decreased scar mobility, Impaired UE functional use, Pain, Decreased strength  Visit Diagnosis: Stiffness of left wrist joint  Muscle weakness  Pain of left forearm  Stiffness of right wrist joint    Problem List Patient Active Problem List   Diagnosis Date Noted  . Wrist fracture, bilateral 06/29/2015    Oletta CohnuPreez, Philemon Riedesel  OTR/L,CLT   10/06/2015, 2:53 PM  [facility] [facility] [address] [place], [place], [address] Phone: [phone]   Fax:  [phone]  Name: Olen CordialLinda M Starliper MRN: [id number] Date of Birth: 03/15/1961

## 2015-10-06 NOTE — Patient Instructions (Signed)
Add to HEP  USed supination wheel  on pt's leg -  correct movement rotating out of forearm - pt did get it -     PROM for RD reviewed again , followed by AAROM on table with hand on paper to get increase RD   Wrist extention - on table for prayer stretch     1 lbs weight for wrist supination and pronation  10 reps  

## 2015-10-08 ENCOUNTER — Ambulatory Visit: Payer: BLUE CROSS/BLUE SHIELD | Admitting: Occupational Therapy

## 2015-10-10 ENCOUNTER — Ambulatory Visit: Payer: BLUE CROSS/BLUE SHIELD | Admitting: Occupational Therapy

## 2015-10-10 DIAGNOSIS — M6281 Muscle weakness (generalized): Secondary | ICD-10-CM

## 2015-10-10 DIAGNOSIS — M25632 Stiffness of left wrist, not elsewhere classified: Secondary | ICD-10-CM | POA: Diagnosis not present

## 2015-10-10 DIAGNOSIS — M79632 Pain in left forearm: Secondary | ICD-10-CM

## 2015-10-10 NOTE — Patient Instructions (Signed)
Pt to do teal putty to L and green on R - 2 x 12 reps   And then 4 lbs ball  for UD and RD in supine  And 2 lbs ball for supination   10 reps  2 lbs for wrist extention  10 reps  2 x day

## 2015-10-10 NOTE — Therapy (Signed)
[facility] [address] [place], [place], [address] Phone: [phone]   Fax:  [phone]  Occupational Therapy Treatment  Patient Details  Name: Shirley Rose MRN: [id number] Date of Birth: [birth year] Referring Provider: Rosita Keamenz  Encounter Date: 10/10/2015      OT End of Session - 10/10/15 1414    Visit Number 4   Number of Visits 8   Date for OT Re-Evaluation 10/24/15   OT Start Time 1300   OT Stop Time 1350   OT Time Calculation (min) 50 min   Activity Tolerance Patient tolerated treatment well;Patient limited by pain   Behavior During Therapy [facility] for tasks assessed/performed      Past Medical History  Diagnosis Date  . Anxiety     Past Surgical History  Procedure Laterality Date  . Orif wrist fracture Bilateral 06/30/2015    Procedure: OPEN REDUCTION INTERNAL FIXATION (ORIF) WRIST FRACTURE;  Surgeon: Kennedy BuckerMichael Menz, MD;  Location: [facility] ORS;  Service: Orthopedics;  Laterality: Bilateral;  . Tubal ligation    . Minor hardware removal Left 09/18/2015    Procedure: LEFT WRIST HARDWARE REMOVAL, DISTAL RADIUS/ULNA JOINT DEBRIDEMENT ;  Surgeon: Kennedy BuckerMichael Menz, MD;  Location: [facility] ORS;  Service: Orthopedics;  Laterality: Left;    There were no vitals filed for this visit.      Subjective Assessment - 10/10/15 1404    Subjective  I think it is better - turning more - it just get stiff fast - I think I can do more than 1 lbs - no pain - at the most about 1-2/10    Patient Stated Goals Want to get my Range of motion better  palm up and up and down -  washing my hair , put on lotions , cooking  - want to get my palm up and better with that - hand things     Currently in Pain? No/denies            [facility] - 10/10/15 0001    AROM   Left Forearm Pronation 80 Degrees   Left Forearm Supination 55 Degrees   Strength   Right Hand Grip (lbs) 31   Right Hand Lateral Pinch 11.5 lbs   Right Hand 3 Point  Pinch 13 lbs   Left Hand Grip (lbs) 15   Left Hand Lateral Pinch 9 lbs   Left Hand 3 Point Pinch 9 lbs                  OT Treatments/Exercises (OP) - 10/10/15 0001    LUE Paraffin   Number Minutes Paraffin 10 Minutes   LUE Paraffin Location Hand;Wrist   Comments Wrist into supination - with heatingpad around [facility]       PROM for supination  Followed by AAROM and AROM for supination and pronation  CPM on BTE for supination - 200 sec  Then 0 lbs on BTE tool 601 for supination - place and hold 2 x 120 sec - unable to finish last 20 sec - by effort  RD and UD - hands together can do AROM   and supine with 4 lbs ball - RD and UD  20 reps   And 2 lbs ball for supination stretch - from palm to palm - forearm on pillow - relax into stretch  Assess grip and prehension R and L hand - see flowsheet  add putty for grip teal on L and  green on R 10 reps x 2   Scar massage done - and mobs - Kinesiotape done 20% pull parallel to scar and 3 across - 100 % pull            OT Education - 10/10/15 1414    Education provided Yes   Education Details HEP   Person(s) Educated Patient   Methods Explanation;Demonstration;Tactile cues;Verbal cues;Handout   Comprehension Verbal cues required;Returned demonstration;Verbalized understanding          OT Short Term Goals - 10/10/15 1417    OT SHORT TERM GOAL #1   Title Wrist supination and pronation on L wrist improve to WNL to  turn doorknob    Baseline sup55 , pronation 80   Time 3   Period Weeks   Status On-going   OT SHORT TERM GOAL #2   Title Pt to be in in HEP to improve wrist exention to 60 and sup/pro WNL  to use in bathing and dressing    Baseline improving    Time 3   Period Weeks   Status On-going           OT Long Term Goals - 10/10/15 1417    OT LONG TERM GOAL #1   Title Pain in L wrist improve on PRWHE by 8 points    Baseline  Pain on PRWHE 11/50    Time 4   Period Weeks    Status On-going   OT LONG TERM GOAL #2   Title Function on PRWHE improve with 15 points using L hand and wrist    Baseline progresssing    Time 4   Period Weeks   Status On-going               Plan - 10/10/15 1415    Clinical Impression Statement Pt making progress slow but steady in supination - strength nad pain - cont to be limted and in RD - pt HEP updated and add putty for grip bilatearal    Rehab Potential Excellent   OT Frequency 2x / week   OT Duration 4 weeks   OT Treatment/Interventions Self-care/ADL training;Parrafin;Fluidtherapy;Patient/family education;Therapeutic exercises;Scar mobilization;Passive range of motion;Manual Therapy   Plan UPgrade as needed    OT Home Exercise Plan see pt instruciton    Consulted and Agree with Plan of Care Patient      Patient will benefit from skilled therapeutic intervention in order to improve the following deficits and impairments:  Decreased range of motion, Impaired flexibility, Decreased knowledge of precautions, Decreased scar mobility, Impaired UE functional use, Pain, Decreased strength  Visit Diagnosis: Stiffness of left wrist joint  Muscle weakness  Pain of left forearm    Problem List Patient Active Problem List   Diagnosis Date Noted  . Wrist fracture, bilateral 06/29/2015    Shirley Rose OTR/L,CLT  10/10/2015, 2:19 PM  [place] [facility] [address] [place], [address] Phone: [phone]   Fax:  [phone]  Name: Shirley Rose MRN: [id number] Date of Birth: 1960-11-20

## 2015-10-14 ENCOUNTER — Ambulatory Visit: Payer: BLUE CROSS/BLUE SHIELD | Admitting: Occupational Therapy

## 2015-10-14 DIAGNOSIS — M79632 Pain in left forearm: Secondary | ICD-10-CM

## 2015-10-14 DIAGNOSIS — M6281 Muscle weakness (generalized): Secondary | ICD-10-CM

## 2015-10-14 DIAGNOSIS — M25631 Stiffness of right wrist, not elsewhere classified: Secondary | ICD-10-CM

## 2015-10-14 DIAGNOSIS — M25632 Stiffness of left wrist, not elsewhere classified: Secondary | ICD-10-CM | POA: Diagnosis not present

## 2015-10-14 NOTE — Patient Instructions (Addendum)
Pt to add 1/2 dark blue putty to  green for R hand - to increase resistance  cont with  putty for grip teal on L and green on R 10 reps x 2    Kinesiotape done 20% pull parallel to scar and 3 across 100 % pull  Extra provided

## 2015-10-14 NOTE — Therapy (Signed)
[place] [facility] [address] , [place], [address] Phone: [phone]   Fax:  [phone]  Occupational Therapy Treatment  Patient Details  Name: Shirley Rose MRN: [id number] Date of Birth: Nov 01, 1960 Referring Provider: Rosita Keamenz  Encounter Date: 10/14/2015      OT End of Session - 10/14/15 1401    Visit Number 5   Number of Visits 8   Date for OT Re-Evaluation 10/24/15   OT Start Time 1337   OT Stop Time 1420   OT Time Calculation (min) 43 min   Activity Tolerance Patient tolerated treatment well;Patient limited by pain   Behavior During Therapy [facility] for tasks assessed/performed      Past Medical History  Diagnosis Date  . Anxiety     Past Surgical History  Procedure Laterality Date  . Orif wrist fracture Bilateral 06/30/2015    Procedure: OPEN REDUCTION INTERNAL FIXATION (ORIF) WRIST FRACTURE;  Surgeon: Kennedy BuckerMichael Menz, MD;  Location: [facility] ORS;  Service: Orthopedics;  Laterality: Bilateral;  . Tubal ligation    . Minor hardware removal Left 09/18/2015    Procedure: LEFT WRIST HARDWARE REMOVAL, DISTAL RADIUS/ULNA JOINT DEBRIDEMENT ;  Surgeon: Kennedy BuckerMichael Menz, MD;  Location: [facility] ORS;  Service: Orthopedics;  Laterality: Left;    There were no vitals filed for this visit.      Subjective Assessment - 10/14/15 1344    Subjective  I can tell it is better - I am trying to use it more - doing the putty at least 2 x  day - did reach this am for milk - but not ready    Patient Stated Goals Want to get my Range of motion better  palm up and up and down -  washing my hair , put on lotions , cooking  - want to get my palm up and better with that - hand things     Currently in Pain? No/denies                      OT Treatments/Exercises (OP) - 10/14/15 0001    LUE Paraffin   Number Minutes Paraffin 10 Minutes   LUE Paraffin Location Hand;Wrist   Comments Wrist in suinatoin with heatingpad into supination  strethc ast SOC       aped removed from scar Scar mobs done with and without wrist extention   PROM for supination CPM on BTE for supination - 200 sec  Then 0 lbs on BTE tool 601 for supination  - 2 x 120   RD and UD - hands together can do AROM  and supine with 4 lbs ball - RD and UD 20 reps   CPM for wrist extention  200 sec  Wrist extention  1 lbs ball on wall 1 min  Pt to add 1/2 dark blue putty to  green for R hand - to increase resistance  cont with  putty for grip teal on L and green on R 10 reps x 2    Kinesiotape done 20% pull parallel to scar and 3 across 100 % pull  Extra provided             OT Education - 10/14/15 1401    Education provided Yes   Education Details HEP   Person(s) Educated Patient   Methods Explanation;Demonstration;Tactile cues;Verbal cues   Comprehension Verbal cues required;Returned demonstration;Verbalized understanding          OT Short Term Goals -  10/10/15 1417    OT SHORT TERM GOAL #1   Title Wrist supination and pronation on L wrist improve to WNL to  turn doorknob    Baseline sup55 , pronation 80   Time 3   Period Weeks   Status On-going   OT SHORT TERM GOAL #2   Title Pt to be in in HEP to improve wrist exention to 60 and sup/pro WNL  to use in bathing and dressing    Baseline improving    Time 3   Period Weeks   Status On-going           OT Long Term Goals - 10/10/15 1417    OT LONG TERM GOAL #1   Title Pain in L wrist improve on PRWHE by 8 points    Baseline  Pain on PRWHE 11/50    Time 4   Period Weeks   Status On-going   OT LONG TERM GOAL #2   Title Function on PRWHE improve with 15 points using L hand and wrist    Baseline progresssing    Time 4   Period Weeks   Status On-going               Plan - 10/14/15 1401    Clinical Impression Statement Pt showed good progress in supination this past week - pt doing better not to compensate with wrist but more movement out of forearm -  cont kinesiotaping scar and increase resistance for putty on R    Rehab Potential Excellent   OT Frequency 2x / week   OT Duration 4 weeks   OT Treatment/Interventions Self-care/ADL training;Parrafin;Fluidtherapy;Patient/family education;Therapeutic exercises;Scar mobilization;Passive range of motion;Manual Therapy   Plan upgrade as needed    OT Home Exercise Plan see pt instruciton    Consulted and Agree with Plan of Care Patient      Patient will benefit from skilled therapeutic intervention in order to improve the following deficits and impairments:  Decreased range of motion, Impaired flexibility, Decreased knowledge of precautions, Decreased scar mobility, Impaired UE functional use, Pain, Decreased strength  Visit Diagnosis: Stiffness of left wrist joint  Muscle weakness  Pain of left forearm  Stiffness of right wrist joint    Problem List Patient Active Problem List   Diagnosis Date Noted  . Wrist fracture, bilateral 06/29/2015    Oletta Cohn OTR/L,CLT  10/14/2015, 2:27 PM  [place] [facility] [address], [place], [address] Phone: [phone]   Fax:  [phone]  Name: Shirley Rose MRN: [id number] Date of Birth: April 05, 1961

## 2015-10-20 ENCOUNTER — Ambulatory Visit: Payer: BLUE CROSS/BLUE SHIELD | Attending: Orthopedic Surgery | Admitting: Occupational Therapy

## 2015-10-20 DIAGNOSIS — M25632 Stiffness of left wrist, not elsewhere classified: Secondary | ICD-10-CM | POA: Insufficient documentation

## 2015-10-20 DIAGNOSIS — M25631 Stiffness of right wrist, not elsewhere classified: Secondary | ICD-10-CM | POA: Diagnosis present

## 2015-10-20 DIAGNOSIS — M79632 Pain in left forearm: Secondary | ICD-10-CM | POA: Diagnosis present

## 2015-10-20 DIAGNOSIS — M6281 Muscle weakness (generalized): Secondary | ICD-10-CM | POA: Insufficient documentation

## 2015-10-20 NOTE — Patient Instructions (Signed)
n

## 2015-10-20 NOTE — Therapy (Signed)
[place] [facility] [address] [place], [address] Phone: [phone]   Fax:  [phone]  Occupational Therapy Treatment  Patient Details  Name: Shirley Rose MRN: [id number] Date of Birth: [birth year] Referring Provider: Rosita Kea  Encounter Date: 10/20/2015      OT End of Session - 10/20/15 1331    Visit Number 6   Number of Visits 8   Date for OT Re-Evaluation 10/24/15   OT Start Time 1115   OT Stop Time 1201   OT Time Calculation (min) 46 min   Activity Tolerance Patient tolerated treatment well;Patient limited by pain   Behavior During Therapy [facility] for tasks assessed/performed      Past Medical History  Diagnosis Date  . Anxiety     Past Surgical History  Procedure Laterality Date  . Orif wrist fracture Bilateral 06/30/2015    Procedure: OPEN REDUCTION INTERNAL FIXATION (ORIF) WRIST FRACTURE;  Surgeon: Kennedy Bucker, MD;  Location: [facility] ORS;  Service: Orthopedics;  Laterality: Bilateral;  . Tubal ligation    . Minor hardware removal Left 09/18/2015    Procedure: LEFT WRIST HARDWARE REMOVAL, DISTAL RADIUS/ULNA JOINT DEBRIDEMENT ;  Surgeon: Kennedy Bucker, MD;  Location: [facility] ORS;  Service: Orthopedics;  Laterality: Left;    There were no vitals filed for this visit.      Subjective Assessment - 10/20/15 1146    Subjective  My pain was little worse yesterday - I worked it really hard over the weekend with the weight - today better    Patient Stated Goals Want to get my Range of motion better  palm up and up and down -  washing my hair , put on lotions , cooking  - want to get my palm up and better with that - hand things     Currently in Pain? No/denies            [facility] OT Assessment - 10/20/15 0001    Strength   Right Hand Grip (lbs) 40   Left Hand Grip (lbs) 21                  OT Treatments/Exercises (OP) - 10/20/15 0001    LUE Paraffin   Number Minutes Paraffin 10 Minutes   LUE  Paraffin Location Hand;Wrist   Comments At [facility] with wrist in supinaton with heatinpad for stretch     Scar mobs done with and without wrist extention - and using xtractor this date -  PROM for supination L  CPM on BTE for supination - 200 sec  Then 0 lbs on BTE tool 601 for supination - 120 sec   RD and UD - hands together can do AROM  and supine with 3 kg  ball - RD and UD 20 reps   PROM and prolonged stretch for supination  Place and hold   followed by BTE again 120 sec  At 0 lbs   Grip strength assess R and L  See flowsheet - increase will upgrade again next sessio            OT Education - 10/20/15 1331    Education provided Yes   Education Details HEP   Person(s) Educated Patient   Methods Explanation;Demonstration;Tactile cues;Verbal cues   Comprehension Verbal cues required;Returned demonstration;Verbalized understanding          OT Short Term Goals - 10/10/15 1417    OT SHORT TERM GOAL #1   Title Wrist supination and  pronation on L wrist improve to WNL to  turn doorknob    Baseline sup55 , pronation 80   Time 3   Period Weeks   Status On-going   OT SHORT TERM GOAL #2   Title Pt to be in in HEP to improve wrist exention to 60 and sup/pro WNL  to use in bathing and dressing    Baseline improving    Time 3   Period Weeks   Status On-going           OT Long Term Goals - 10/10/15 1417    OT LONG TERM GOAL #1   Title Pain in L wrist improve on PRWHE by 8 points    Baseline  Pain on PRWHE 11/50    Time 4   Period Weeks   Status On-going   OT LONG TERM GOAL #2   Title Function on PRWHE improve with 15 points using L hand and wrist    Baseline progresssing    Time 4   Period Weeks   Status On-going               Plan - 10/20/15 1331    Clinical Impression Statement  Pt cont to make progress in ROM at supination and grip strength - pt has appt with MD this Wed - will increase putty resistance    Rehab Potential Excellent   OT  Frequency 2x / week   OT Duration 4 weeks   OT Treatment/Interventions Self-care/ADL training;Parrafin;Fluidtherapy;Patient/family education;Therapeutic exercises;Scar mobilization;Passive range of motion;Manual Therapy   Plan upgrade HEP as needed    OT Home Exercise Plan see pt instruciton    Consulted and Agree with Plan of Care Patient      Patient will benefit from skilled therapeutic intervention in order to improve the following deficits and impairments:  Decreased range of motion, Impaired flexibility, Decreased knowledge of precautions, Decreased scar mobility, Impaired UE functional use, Pain, Decreased strength  Visit Diagnosis: Stiffness of left wrist joint  Muscle weakness  Pain of left forearm  Stiffness of right wrist joint    Problem List Patient Active Problem List   Diagnosis Date Noted  . Wrist fracture, bilateral 06/29/2015    Oletta CohnuPreez, Nikash Mortensen OTR/L,CLT  10/20/2015, 1:33 PM  [place] [facility] [address] [place], [place], [address] Phone: [phone]   Fax:  [phone]  Name: Shirley Rose MRN: [id number] Date of Birth: 30-Dec-1960

## 2015-10-24 ENCOUNTER — Ambulatory Visit: Payer: BLUE CROSS/BLUE SHIELD | Admitting: Occupational Therapy

## 2015-10-24 DIAGNOSIS — M6281 Muscle weakness (generalized): Secondary | ICD-10-CM

## 2015-10-24 DIAGNOSIS — M25632 Stiffness of left wrist, not elsewhere classified: Secondary | ICD-10-CM | POA: Diagnosis not present

## 2015-10-24 NOTE — Patient Instructions (Signed)
Scar mobs done with Graston tools nr 2 and 3 - sweeping, scooping on forearm and brushing with 3 - scar still tight   PROM for supination and prolonged stretch into supination after parafin and large heatingpad Cuff weight around hand /wrist 2 lbs 20 reps  And then 3 lbs 20 reps  Several cues to maintain elbow to side - not to compensate  Then 0 lbs on BTE tool 601 for supination - 120 sec   Supination wheel change to do on counter and elbow height - place object inside elbow to prevent compensation  15 reps  Place and hold with wheel 10 reps  Then add 2 lbs - was able to do but burn on ulnar side of wrist  Decrease to  1lbs on wheel - pt to do wheel with 1lbs weight at home - counter height - keep elbow in place  And increase to several times during day to decrease stiffness

## 2015-10-24 NOTE — Therapy (Signed)
[place] [facility] [address], [place], [address] Phone: [phone]   Fax:  [phone]  Occupational Therapy Treatment  Patient Details  Name: Shirley Rose MRN: [id number] Date of Birth: 11-09-1960 Referring Provider: Rosita Kea  Encounter Date: 10/24/2015      OT End of Session - 10/24/15 1404    Visit Number 7   Number of Visits 8   Date for OT Re-Evaluation 10/24/15   OT Start Time 1230   OT Stop Time 1319   OT Time Calculation (min) 49 min   Activity Tolerance Patient tolerated treatment well;Patient limited by pain   Behavior During Therapy [facility] for tasks assessed/performed      Past Medical History  Diagnosis Date  . Anxiety     Past Surgical History  Procedure Laterality Date  . Orif wrist fracture Bilateral 06/30/2015    Procedure: OPEN REDUCTION INTERNAL FIXATION (ORIF) WRIST FRACTURE;  Surgeon: Kennedy Bucker, MD;  Location: [facility] ORS;  Service: Orthopedics;  Laterality: Bilateral;  . Tubal ligation    . Minor hardware removal Left 09/18/2015    Procedure: LEFT WRIST HARDWARE REMOVAL, DISTAL RADIUS/ULNA JOINT DEBRIDEMENT ;  Surgeon: Kennedy Bucker, MD;  Location: [facility] ORS;  Service: Orthopedics;  Laterality: Left;    There were no vitals filed for this visit.      Subjective Assessment - 10/24/15 1234    Subjective  No pain, seen MD he did expect more - but to cont - will see him  in 4 wks - using it more - easier to wash hair, do  things    Patient Stated Goals Want to get my Range of motion better  palm up and up and down -  washing my hair , put on lotions , cooking  - want to get my palm up and better with that - hand things     Currently in Pain? No/denies            [facility] OT Assessment - 10/24/15 0001    Strength   Right Hand Grip (lbs) 40   Right Hand Lateral Pinch 12 lbs   Right Hand 3 Point Pinch 15 lbs   Left Hand Grip (lbs) 21   Left Hand Lateral Pinch 10 lbs   Left Hand 3 Point  Pinch 10 lbs                  OT Treatments/Exercises (OP) - 10/24/15 0001    LUE Paraffin   Number Minutes Paraffin 10 Minutes   LUE Paraffin Location Hand;Wrist   Comments At [facility] wiht large heating pad to increase supinatoin        Scar mobs done with Graston tools nr 2 and 3 - sweeping, scooping on forearm and brushing with 3 - scar still tight   PROM for supination and prolonged stretch into supination after parafin and large heatingpad Cuff weight around hand /wrist 2 lbs 20 reps  And then 3 lbs 20 reps  Several cues to maintain elbow to side - not to compensate  Then 0 lbs on BTE tool 601 for supination - 120 sec   Supination wheel change to do on counter and elbow height - place object inside elbow to prevent compensation  15 reps  Place and hold with wheel 10 reps  Then add 2 lbs - was able to do but burn on ulnar side of wrist  Decrease to  1lbs on wheel - pt to do  wheel with 1lbs weight at home - counter height - keep elbow in place  And increase to several times during day to decrease stiffness  Putty upgrade to dark blue for R hand and light green for L - grip did not improve but prehension L and R did           OT Education - 10/24/15 1404    Education provided Yes   Education Details HEP   Person(s) Educated Patient   Methods Explanation;Demonstration;Tactile cues   Comprehension Verbalized understanding;Returned demonstration;Verbal cues required          OT Short Term Goals - 10/10/15 1417    OT SHORT TERM GOAL #1   Title Wrist supination and pronation on L wrist improve to WNL to  turn doorknob    Baseline sup55 , pronation 80   Time 3   Period Weeks   Status On-going   OT SHORT TERM GOAL #2   Title Pt to be in in HEP to improve wrist exention to 60 and sup/pro WNL  to use in bathing and dressing    Baseline improving    Time 3   Period Weeks   Status On-going           OT Long Term Goals - 10/10/15 1417    OT LONG TERM  GOAL #1   Title Pain in L wrist improve on PRWHE by 8 points    Baseline  Pain on PRWHE 11/50    Time 4   Period Weeks   Status On-going   OT LONG TERM GOAL #2   Title Function on PRWHE improve with 15 points using L hand and wrist    Baseline progresssing    Time 4   Period Weeks   Status On-going               Plan - 10/24/15 1405    Clinical Impression Statement Pt do show progress but after exercise sessions and working on computer she gets stiff again - and pt do compensate with elbow internal rotation - reviewed pt HEP and using supination wheel - upgrade her putty  on L and R by one level    Rehab Potential Excellent   OT Frequency 1x / week   OT Duration 4 weeks   OT Treatment/Interventions Self-care/ADL training;Parrafin;Fluidtherapy;Patient/family education;Therapeutic exercises;Scar mobilization;Passive range of motion;Manual Therapy   Plan reassess progress and review/update HEP   OT Home Exercise Plan see pt instruciton    Consulted and Agree with Plan of Care Patient      Patient will benefit from skilled therapeutic intervention in order to improve the following deficits and impairments:  Decreased range of motion, Impaired flexibility, Decreased knowledge of precautions, Decreased scar mobility, Impaired UE functional use, Pain, Decreased strength  Visit Diagnosis: Stiffness of left wrist joint  Muscle weakness    Problem List Patient Active Problem List   Diagnosis Date Noted  . Wrist fracture, bilateral 06/29/2015    Oletta CohnuPreez, Hendrik Donath OTR/L,CLT 10/24/2015, 2:10 PM  [place] [facility] [address] [place], [place], [address] Phone: [phone]   Fax:  [phone]  Name: Shirley Rose MRN: [id number] Date of Birth: 08/05/1960

## 2015-10-27 ENCOUNTER — Encounter: Payer: BLUE CROSS/BLUE SHIELD | Admitting: Occupational Therapy

## 2015-10-31 ENCOUNTER — Encounter: Payer: BLUE CROSS/BLUE SHIELD | Admitting: Occupational Therapy

## 2015-10-31 ENCOUNTER — Ambulatory Visit: Payer: BLUE CROSS/BLUE SHIELD | Admitting: Occupational Therapy

## 2015-10-31 DIAGNOSIS — M6281 Muscle weakness (generalized): Secondary | ICD-10-CM

## 2015-10-31 DIAGNOSIS — M25631 Stiffness of right wrist, not elsewhere classified: Secondary | ICD-10-CM

## 2015-10-31 DIAGNOSIS — M79632 Pain in left forearm: Secondary | ICD-10-CM

## 2015-10-31 DIAGNOSIS — M25632 Stiffness of left wrist, not elsewhere classified: Secondary | ICD-10-CM

## 2015-10-31 NOTE — Patient Instructions (Signed)
Pt to do  kinesiotape  for scar mobs - xtra cut for pt to replace at home  Ed on 20% pull and across 100% pull    Several cues for pt to do ROM - and not force - to prevent all muscle to tighten up    COnt Supination wheel with 1 lbs cuff weight on whell -  to do on  counter and elbow height - place object inside elbow to prevent compensation  15 reps  Place and hold with wheel 10 reps    Recommend for pt toget parafin bath to use at home - followed by husband assisting with PROM

## 2015-10-31 NOTE — Addendum Note (Signed)
Addended by: Oletta CohnUPREEZ, Rube Sanchez on: 10/31/2015 05:49 PM   Modules accepted: Orders

## 2015-10-31 NOTE — Therapy (Signed)
[facility] PH[facility] AND SPOR[facility] MEDICINE [address] [place], [place], [address] Phone: [phone]   Fax:  [phone]  Occupational Therapy Treatment  Patient Details  Name: Shirley Rose MRN: [id number] Date of Birth: [birth year] Referring Provider: Rosita Keamenz  Encounter Date: 10/31/2015      OT End of Session - 10/31/15 1738    Visit Number 8   Number of Visits 10   Date for OT Re-Evaluation 10/28/15   OT Start Time 0850   OT Stop Time 0933   OT Time Calculation (min) 43 min   Activity Tolerance Patient tolerated treatment well   Behavior [facility] for tasks assessed/performed      Past Medical History  Diagnosis Date  . Anxiety     Past Surgical History  Procedure Laterality Date  . Orif wrist fracture Bilateral 06/30/2015    Procedure: OPEN REDUCTION INTERNAL FIXATION (ORIF) WRIST FRACTURE;  Surgeon: Kennedy BuckerMichael Menz, MD;  Location: [facility] ORS;  Service: Orthopedics;  Laterality: Bilateral;  . Tubal ligation    . Minor hardware removal Left 09/18/2015    Procedure: LEFT WRIST HARDWARE REMOVAL, DISTAL RADIUS/ULNA JOINT DEBRIDEMENT ;  Surgeon: Kennedy BuckerMichael Menz, MD;  Location: [facility] ORS;  Service: Orthopedics;  Laterality: Left;    There were no vitals filed for this visit.      Subjective Assessment - 10/31/15 0904    Subjective  I thought the wheel with weight lmiited me - still hard to do - husband helped with the one exercise - during day try and get off the computer anddo some supination every 2 hrs   Patient Stated Goals Want to get my Range of motion better  palm up and up and down -  washing my hair , put on lotions , cooking  - want to get my palm up and better with that - hand things     Currently in Pain? Yes   Pain Score 1    Pain Location Wrist   Pain Orientation Left   Pain Descriptors / Indicators Aching            [facility] OT Assessment - 10/31/15 0001    AROM   Left Forearm Supination 70 Degrees                   OT Treatments/Exercises (OP) - 10/31/15 0001    LUE Paraffin   Number Minutes Paraffin 10 Minutes   LUE Paraffin Location Hand;Wrist   Comments AT SOC to increase with large heatinpad for supinaton strethc       Scar mobs done with Graston tools nr 2 and 3 - sweeping, scooping on forearm and brushing with 3 - scar still tight and tender  Did kinesiotape at end of session for scar mobs - xtra cut for pt to replace at home  Ed on 20% pull and across 100% pull   PROM for supination and prolonged stretch into supination after parafin and large heating pad Place and hold done supination 10 reps  Several cues for pt to do ROM - and not force - to prevent all muscle to tighten up    Supination wheel with 1 lbs cuff weight on whell -  to do on  counter and elbow height - place object inside elbow to prevent compensation  15 reps  Place and hold with wheel 10 reps   Table slides for Wrist extnetion done this date 15 reps  Recommend for pt toget parafin bath to  use at home - followed by husband assisting with PROM              OT Education - 10/31/15 1738    Education provided Yes   Education Details HEP   Person(s) Educated Patient   Methods Demonstration;Tactile cues;Verbal cues;Explanation   Comprehension Verbalized understanding;Returned demonstration;Verbal cues required          OT Short Term Goals - 10/31/15 1742    OT SHORT TERM GOAL #1   Title Wrist supination and pronation on L wrist improve to WNL to  turn doorknob    Baseline supination improved from 55 to 70 ,   Time 3   Period Weeks   Status On-going   OT SHORT TERM GOAL #2   Title Pt to be in in HEP to improve wrist exention to 60 and sup/pro WNL  to use in bathing and dressing    Baseline improving    Time 3   Period Weeks   Status On-going           OT Long Term Goals - 10/31/15 1743    OT LONG TERM GOAL #1   Title Pain in L wrist improve on PRWHE by 8 points     Baseline Pain only with HEP    Status Achieved   OT LONG TERM GOAL #2   Title Function on PRWHE improve with 15 points using L hand and wrist    Baseline assess next session    Time 4   Period Weeks   Status On-going   OT LONG TERM GOAL #3   Title Pain on PRWHE improve by at least 15 points    Baseline Pain only with HEP    Status Achieved               Plan - 10/31/15 1739    Clinical Impression Statement S[place] improved to 70 degrees - pt need verbal cues to move out of forarm and not wrist - as well as not to force actively - but do more AROM that she do not tighten mucsles - pt show great progress in clinci with parafin - recommend pt to get it for home use - and husband to assist with PROM    Rehab Potential Good   OT Frequency Other (comment)   OT Duration 4 weeks   OT Treatment/Interventions Self-care/ADL training;Parrafin;Fluidtherapy;Patient/family education;Therapeutic exercises;Scar mobilization;Passive range of motion;Manual Therapy   Plan assess progress in week and PRWHE    OT Home Exercise Plan see pt instruciton    Consulted and Agree with Plan of Care Patient      Patient will benefit from skilled therapeutic intervention in order to improve the following deficits and impairments:  Decreased range of motion, Impaired flexibility, Decreased knowledge of precautions, Decreased scar mobility, Impaired UE functional use, Pain, Decreased strength  Visit Diagnosis: Stiffness of left wrist joint  Muscle weakness  Pain of left forearm  Stiffness of right wrist joint    Problem List Patient Active Problem List   Diagnosis Date Noted  . Wrist fracture, bilateral 06/29/2015    Oletta Cohn OTR/L,CLT  10/31/2015, 5:44 PM  [facility] PHYSICAL AND SPORTS MEDICINE [address] [place], [address] Phone: [phone]   Fax:  [phone]  Name: Shirley Rose MRN: [id number] Date of Birth: 09/23/1960

## 2015-11-06 ENCOUNTER — Encounter: Payer: BLUE CROSS/BLUE SHIELD | Admitting: Occupational Therapy

## 2015-11-07 ENCOUNTER — Ambulatory Visit: Payer: BLUE CROSS/BLUE SHIELD | Admitting: Occupational Therapy

## 2015-12-04 ENCOUNTER — Encounter: Payer: BLUE CROSS/BLUE SHIELD | Admitting: Occupational Therapy

## 2015-12-05 ENCOUNTER — Ambulatory Visit: Payer: BLUE CROSS/BLUE SHIELD | Attending: Orthopedic Surgery | Admitting: Occupational Therapy

## 2015-12-05 DIAGNOSIS — M79632 Pain in left forearm: Secondary | ICD-10-CM | POA: Insufficient documentation

## 2015-12-05 DIAGNOSIS — M25632 Stiffness of left wrist, not elsewhere classified: Secondary | ICD-10-CM

## 2015-12-05 DIAGNOSIS — M6281 Muscle weakness (generalized): Secondary | ICD-10-CM | POA: Insufficient documentation

## 2015-12-05 DIAGNOSIS — M25631 Stiffness of right wrist, not elsewhere classified: Secondary | ICD-10-CM

## 2015-12-05 NOTE — Patient Instructions (Signed)
To cont another 2-3 wks with putty grip on L , wrist extention slides on table  And heat and PROM , weight for supination

## 2015-12-05 NOTE — Therapy (Signed)
[place] PHYSICAL AND SPORTS MEDICINE [address], [place], [address] Phone: [phone]   Fax:  [phone]  Occupational Therapy Treatment  Patient Details  Name: Shirley Rose MRN: [id number] Date of Birth: [birth year] Referring Provider: Rudene Christians  Encounter Date: 12/05/2015      OT End of Session - 12/05/15 1046    Visit Number 9   Number of Visits 9   Date for OT Re-Evaluation 12/05/15   OT Start Time 0920   OT Stop Time 0959   OT Time Calculation (min) 39 min   Activity Tolerance Patient tolerated treatment well   Behavior During Therapy [facility] for tasks assessed/performed      Past Medical History  Diagnosis Date  . Anxiety     Past Surgical History  Procedure Laterality Date  . Orif wrist fracture Bilateral 06/30/2015    Procedure: OPEN REDUCTION INTERNAL FIXATION (ORIF) WRIST FRACTURE;  Surgeon: Hessie Knows, MD;  Location: [facility] ORS;  Service: Orthopedics;  Laterality: Bilateral;  . Tubal ligation    . Minor hardware removal Left 09/18/2015    Procedure: LEFT WRIST HARDWARE REMOVAL, DISTAL RADIUS/ULNA JOINT DEBRIDEMENT ;  Surgeon: Hessie Knows, MD;  Location: [facility] OR[facility];  Service: Orthopedics;  Laterality: Left;    There were no vitals filed for this visit.      Subjective Assessment - 12/05/15 1045    Subjective  I am doing okay - using hand - still cannot lift gallon of milk out of refrigerator , and some pain pushing up from chair, tight grip maybe on L - but other wise doing okay - wanted you to measure me and compare to month ago    Patient Stated Goals Want to get my Range of motion better  palm up and up and down -  washing my hair , put on lotions , cooking  - want to get my palm up and better with that - hand things     Currently in Pain? No/denies            [facility] OT Assessment - 12/05/15 0001    AROM   Left Forearm Pronation 90 Degrees   Left Forearm Supination 85 Degrees   Right Wrist Extension 54  Degrees   Right Wrist Flexion 85 Degrees   Left Wrist Extension 54 Degrees   Left Wrist Flexion 88 Degrees   Strength   Right Hand Grip (lbs) 40   Right Hand Lateral Pinch 13 lbs   Right Hand 3 Point Pinch 15 lbs   Left Hand Grip (lbs) 27   Left Hand Lateral Pinch 12 lbs   Left Hand 3 Point Pinch 12 lbs      Measurements taken - compare to 3-4 wks ago  Ed pt on HEP to cont at home with - pt still under 3 months out of last surgery - grip on L still decrease , wrist extention bilateral and supination  Cont every day for 3 wks - and then maybe 2-3 x wk   Also done PRWHE with pt for pain and function  Pain still 9/50 - more with weightbearing and HEP  Function improve to 6.5/50                    OT Education - 12/05/15 1046    Education provided Yes   Education Details HEP   Person(s) Educated Patient   Methods Explanation;Demonstration;Tactile cues;Verbal cues   Comprehension Verbal cues required;Returned demonstration;Verbalized understanding  OT Short Term Goals - 12/05/15 1050    OT SHORT TERM GOAL #1   Title Wrist supination and pronation on L wrist improve to WNL to  turn doorknob    Baseline supination  improve from 55 to 85 , pronation WNL    Status Achieved   OT SHORT TERM GOAL #2   Title Pt to be in in HEP to improve wrist exention to 60 and sup/pro WNL  to use in bathing and dressing    Baseline sup 85 and wrist extention rom 40's to 50's - but can do most everything functiionally   Status Achieved           OT Long Term Goals - 12/05/15 1051    OT LONG TERM GOAL #1   Title Pain in L wrist improve on PRWHE by 8 points    Baseline pain only with HEP or pushing up    Status Partially Met   OT LONG TERM GOAL #2   Title Function on PRWHE improve with 15 points using L hand and wrist    Baseline PRWHE improve to 6.5/50   Status Achieved   OT LONG TERM GOAL #3   Title Pain on PRWHE improve by at least 15 points    Baseline pain  only with HEP    Status Partially Met               Plan - 12/05/15 1047    Clinical Impression Statement Pt made some good progress in supination on L , grip on L and wrist extention bilateral hands with HEP the last 3-4 wks - compare to last measurements -met all goals -  pt discharge at this time with home program and to see surgeon in few wks    OT Treatment/Interventions Self-care/ADL training;Parrafin;Fluidtherapy;Patient/family education;Therapeutic exercises;Scar mobilization;Passive range of motion;Manual Therapy   Plan discharge with HEP for another 3 wks    OT Home Exercise Plan see pt instruciton    Consulted and Agree with Plan of Care Patient      Patient will benefit from skilled therapeutic intervention in order to improve the following deficits and impairments:     Visit Diagnosis: Stiffness of left wrist joint  Muscle weakness  Pain of left forearm  Stiffness of right wrist joint    Problem List Patient Active Problem List   Diagnosis Date Noted  . Wrist fracture, bilateral 06/29/2015    Rosalyn Gess OTR/L,CLT  12/05/2015, 10:52 AM  [place] PHYSICAL AND SPORTS MEDICINE [address] [place], [address] Phone: [phone]   Fax:  [phone]  Name: Shirley Rose MRN: [id number] Date of Birth: November 23, 1960

## 2017-06-14 IMAGING — CR DG ANKLE COMPLETE 3+V*L*
1 series · 3 of 3 positions shown · non-contrast
Comparison: Left foot radiographs 11/06/2013

CLINICAL DATA: Left ankle pain, swelling, and redness laterally.
Fall. Initial encounter. Previous ankle injury multiple years ago.

EXAM:
LEFT ANKLE COMPLETE - 3+ VIEW

[Series 1: dg ankle complete left · 0.14mm/px · 3 of 3 slices shown]
[im 1/3]
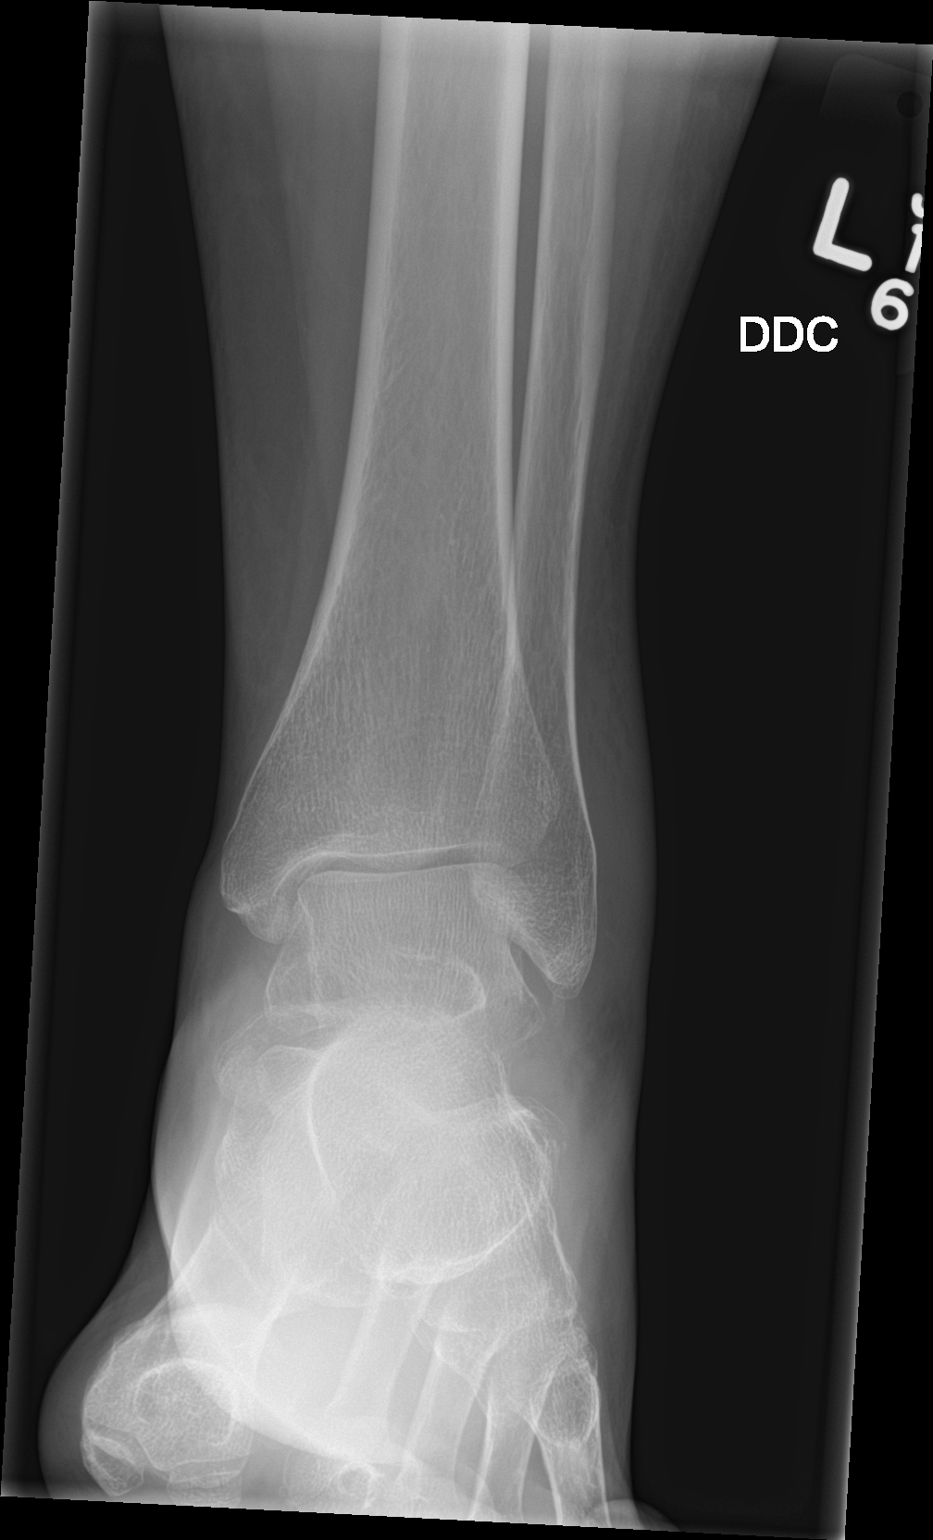
[im 2/3]
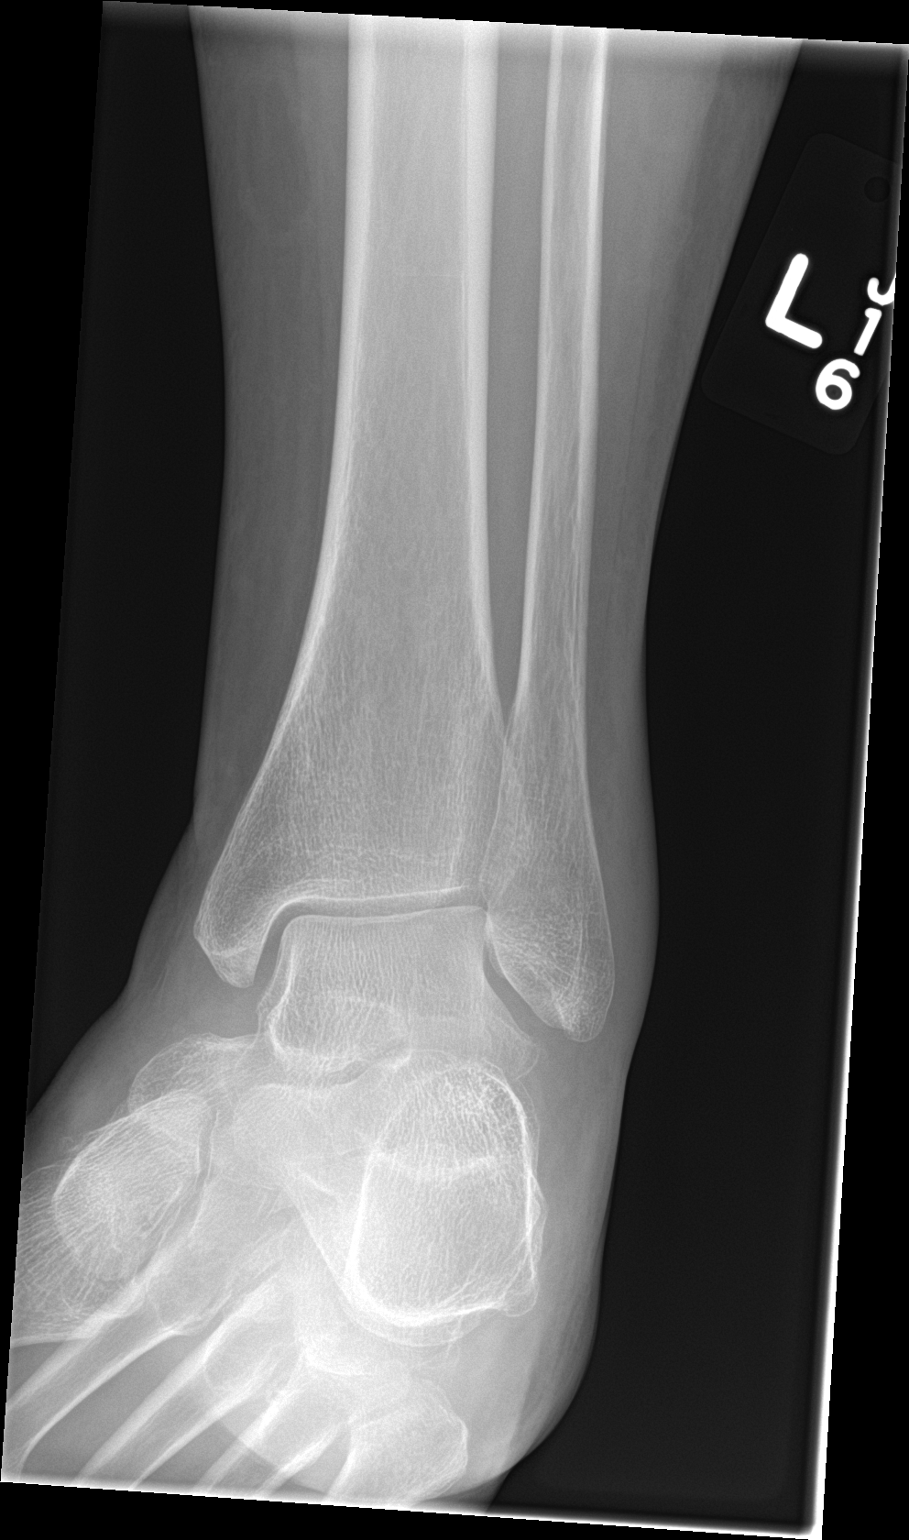
[im 3/3]
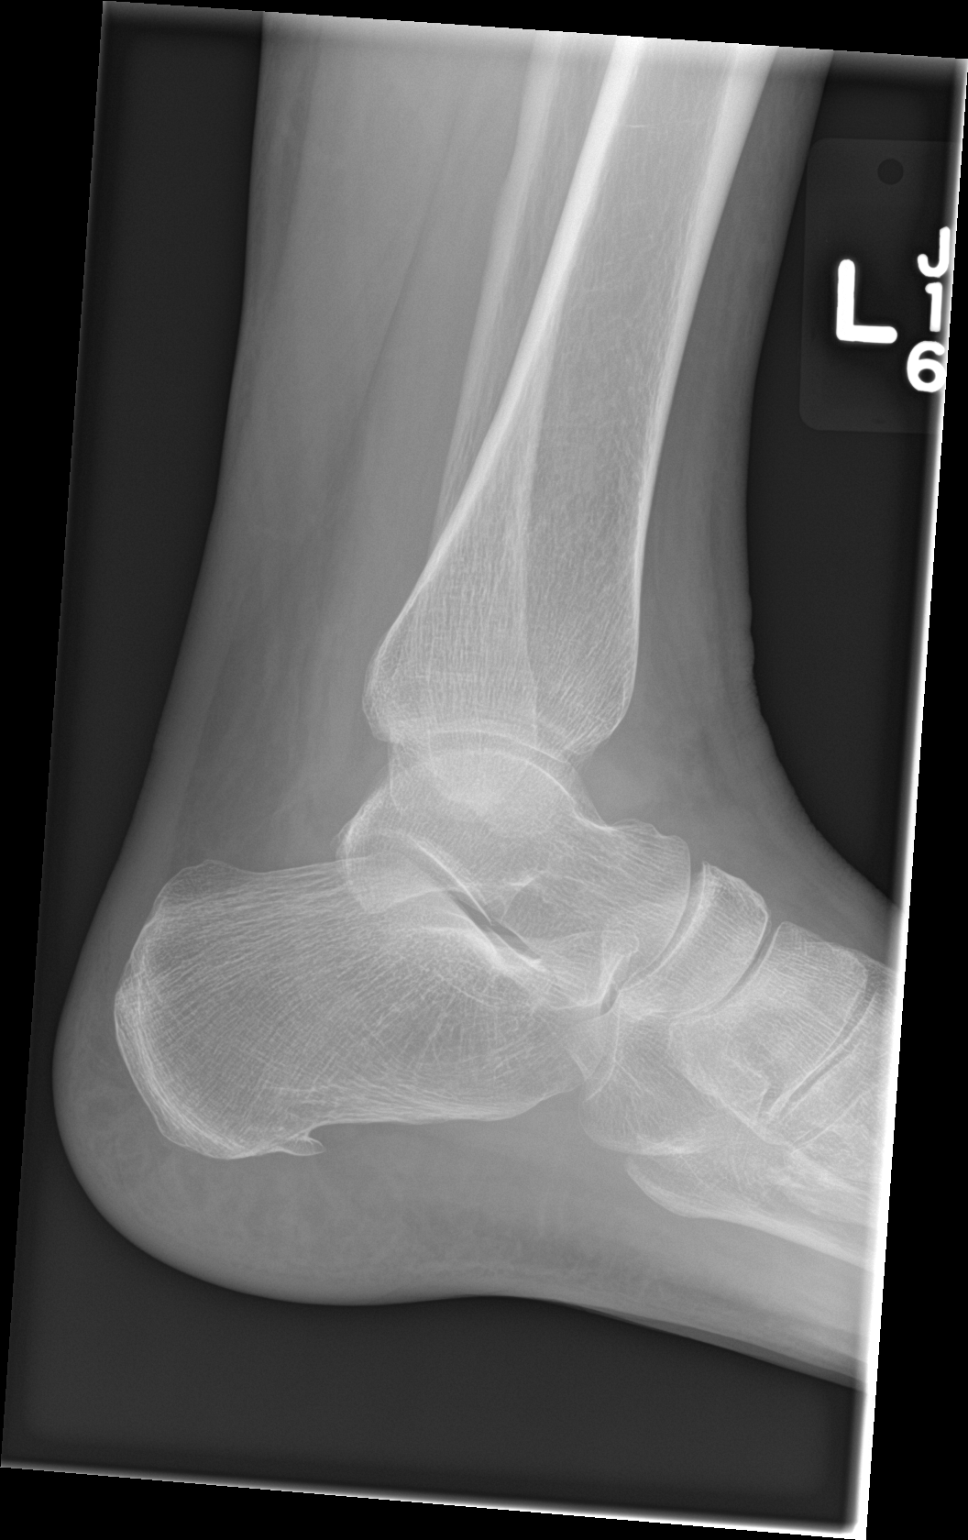

[3 of 3 positions shown; findings below may reference images not displayed]

FINDINGS: A 6 mm ovoid faint calcific/os ossific density projects at the tip
of the lateral malleolus on the AP image but is not well seen on the
other projections. No cleared donor sites or other fracture is
identified. There is no dislocation. Mild soft tissue swelling is
noted about the lateral aspect of the ankle. A small plantar
calcaneal enthesophyte appears slightly larger than on the prior
study.
IMPRESSION: 1. Lateral ankle soft tissue swelling without definite acute osseous
abnormality identified.
2. Faint 6 mm density near the tip of the lateral malleolus on the
AP image, indeterminate. This may reflect the sequelae of prior age
indeterminate trauma or reflect soft tissue calcification.

## 2018-07-19 ENCOUNTER — Emergency Department
Admission: EM | Admit: 2018-07-19 | Discharge: 2018-07-19 | Disposition: A | Discharge status: Home / Self Care | Payer: BLUE CROSS/BLUE SHIELD | Attending: Emergency Medicine | Admitting: Emergency Medicine

## 2018-07-19 ENCOUNTER — Other Ambulatory Visit: Payer: Self-pay

## 2018-07-19 ENCOUNTER — Emergency Department: Payer: BLUE CROSS/BLUE SHIELD

## 2018-07-19 DIAGNOSIS — G459 Transient cerebral ischemic attack, unspecified: Secondary | ICD-10-CM | POA: Diagnosis not present

## 2018-07-19 DIAGNOSIS — Z79899 Other long term (current) drug therapy: Secondary | ICD-10-CM | POA: Insufficient documentation

## 2018-07-19 DIAGNOSIS — R1310 Dysphagia, unspecified: Secondary | ICD-10-CM | POA: Diagnosis present

## 2018-07-19 LAB — COMPREHENSIVE METABOLIC PANEL
ALT: 14 U/L (ref 0–44)
AST: 20 U/L (ref 15–41)
Albumin: 4.5 g/dL (ref 3.5–5.0)
Alkaline Phosphatase: 50 U/L (ref 38–126)
Anion gap: 7 (ref 5–15)
BUN: 8 mg/dL (ref 6–20)
CO2: 27 mmol/L (ref 22–32)
Calcium: 9.2 mg/dL (ref 8.9–10.3)
Chloride: 102 mmol/L (ref 98–111)
Creatinine, Ser: 0.64 mg/dL (ref 0.44–1.00)
GFR calc Af Amer: >60 mL/min (ref >60)
GFR calc non Af Amer: >60 mL/min (ref >60)
Glucose, Bld: 112 mg/dL — ABNORMAL HIGH (ref 70–99)
POTASSIUM: 3.6 mmol/L (ref 3.5–5.1)
Sodium: 136 mmol/L (ref 135–145)
Total Bilirubin: 0.4 mg/dL (ref 0.3–1.2)
Total Protein: 7.8 g/dL (ref 6.5–8.1)

## 2018-07-19 LAB — CBC
HCT: 37.8 % (ref 36.0–46.0)
Hemoglobin: 12.5 g/dL (ref 12.0–15.0)
MCH: 30.7 pg (ref 26.0–34.0)
MCHC: 33.1 g/dL (ref 30.0–36.0)
MCV: 92.9 fL (ref 80.0–100.0)
PLATELETS: 189 10*3/uL (ref 150–400)
RBC: 4.07 MIL/uL (ref 3.87–5.11)
RDW: 12.4 % (ref 11.5–15.5)
WBC: 5.7 10*3/uL (ref 4.0–10.5)
nRBC: 0 % (ref 0.0–0.2)

## 2018-07-19 LAB — DIFFERENTIAL
Abs Immature Granulocytes: 0 10*3/uL (ref 0.00–0.07)
Basophils Absolute: 0.1 10*3/uL (ref 0.0–0.1)
Basophils Relative: 1 %
EOS ABS: 0 10*3/uL (ref 0.0–0.5)
Eosinophils Relative: 0 %
Immature Granulocytes: 0 %
Lymphocytes Relative: 43 %
Lymphs Abs: 2.4 10*3/uL (ref 0.7–4.0)
Monocytes Absolute: 0.5 10*3/uL (ref 0.1–1.0)
Monocytes Relative: 9 %
NEUTROS PCT: 47 %
Neutro Abs: 2.7 10*3/uL (ref 1.7–7.7)

## 2018-07-19 LAB — APTT: aPTT: 32 seconds (ref 24–36)

## 2018-07-19 LAB — ETHANOL: Alcohol, Ethyl (B): <10 mg/dL (ref <10)

## 2018-07-19 LAB — TROPONIN I: Troponin I: <0.03 ng/mL (ref <0.03)

## 2018-07-19 LAB — PROTIME-INR
INR: 1.06
Prothrombin Time: 13.7 seconds (ref 11.4–15.2)

## 2018-07-19 MED ORDER — ASPIRIN 81 MG PO CHEW: 81.0000 mg | CHEWABLE_TABLET | Freq: Every day | ORAL | 0 refills | Status: AC

## 2018-07-19 MED ORDER — ASPIRIN 81 MG PO CHEW
324.0000 mg | CHEWABLE_TABLET | Freq: Once | ORAL | Status: AC
  Administered 2018-07-19: 324 mg via ORAL
  Filled 2018-07-19: qty 4

## 2018-07-19 NOTE — ED Notes (Signed)
This RN on phone with Lewis Moccasin, MD. Lewis Moccasin, MD informed this RN that pt was not a candidate for tPA. Masud, MD to call MD [facility] with recommendations at this time.

## 2018-07-19 NOTE — Progress Notes (Signed)
CODE STROKE- PHARMACY COMMUNICATION   Time CODE STROKE called/page received: 1948  Time response to CODE STROKE was made (in person or via phone): 1950  Time Stroke Kit retrieved from [place] (only if needed): pending (impression @ 2044 was no tpa - awaiting final verdict)  Name of Provider/Nurse contacted: Rachelle (as wheeling pt to room)  Past Medical History:  Diagnosis Date  . Anxiety    Prior to Admission medications   Medication Sig Start Date End Date Taking? Authorizing Provider  buPROPion (WELLBUTRIN XL) 150 MG 24 hr tablet Take 150 mg by mouth 2 (two) times daily.     [provider]  HYDROcodone-acetaminophen (NORCO) 5-325 MG tablet Take 1 tablet by mouth every 6 (six) hours as needed for moderate pain. 09/18/15   Hessie Knows, MD  Multiple Vitamin (MULTIVITAMIN) tablet Take 1 tablet by mouth daily.    [provider]  pimecrolimus (ELIDEL) 1 % cream Apply 1 application topically daily as needed. For rash. Apply to affected areas on the face. 10/16/14   [provider]  RESTASIS 0.05 % ophthalmic emulsion Place 1 drop into both eyes 2 (two) times daily. 03/25/15   [provider]  zolpidem (AMBIEN CR) 6.25 MG CR tablet Take 6.25 mg by mouth at bedtime as needed. For sleep. 06/20/15   [provider]    Lu Duffel, PharmD, BCPS Clinical Pharmacist 07/19/2018 8:38 PM

## 2018-07-19 NOTE — ED Notes (Signed)
Consulted with Dr. Greenwood Lions on pt symptoms and Code stroke ordered, pt cleared by Dr. Pershing Proud at [phone] for Ct.

## 2018-07-19 NOTE — Progress Notes (Signed)
   07/19/18 2000  Clinical Encounter Type  Visited With Patient  Visit Type Code   Code stroke page received. Patient awake, alert and oriented upon arrival. Nurse taking vital signs and preparing to start an IV. Chaplain maintained pastoral presence, talked briefly with the patient about what brought her to the ED. She states that he husband brought her to the ED when she had difficulty swallowing; symptoms have since subsided. Patient stated that she felt "silly" for coming in this case; chaplain provided reassurance that her health matters and it is better to be safe if a concern of this magnitude arises. Patient seemed introspective, but open to that reassurance. Nurse will call or page if necessary.

## 2018-07-19 NOTE — Progress Notes (Signed)
PHARMACIST CODE STROKE RESPONSE  <<<<<<  MD does not want TPA on this pt  >>>>>>>  Notified to mix tPA at  by Dr.  Delivered tPA to RN at   tPA dose = mg bolus over 1 minute followed by mg for a total dose of mg over 1 hour  Issues/delays encountered (if applicable):   Shirley Rose D 07/19/18 9:25 PM

## 2018-07-19 NOTE — ED Notes (Signed)
Teleneuro cart having technical difficulties since turning on. Audio intermittently cutting out. Venia Carbon, MD on teleneuro call at this time.

## 2018-07-19 NOTE — ED Notes (Signed)
MD Schaevitz at bedside. 

## 2018-07-19 NOTE — ED Notes (Signed)
Code stroke called to [phone]  [phone]

## 2018-07-19 NOTE — ED Notes (Signed)
Pt unable to provide urine sample at this time. MD aware. Pt states "I will get my PCP to follow up on the urine if needed".

## 2018-07-19 NOTE — ED Notes (Signed)
This RN to bedside at this time. Pt able to ambulate from wheelchair to stretcher with no problems. Pt with steady gait with ambulation. Pt stating at this time, she feels as though the symptoms are much improved. Pt states she does not have trouble swallowing her spit at this time like she was earlier when the symptoms began. Pt alert and oriented x4 at this time.

## 2018-07-19 NOTE — ED Notes (Signed)
Teleneuro consulted at 2046, Jeanice Lim, RN on screen at this time.

## 2018-07-19 NOTE — ED Notes (Signed)
Pt using ascom to talk to neurologist at this time due to technical difficulties.

## 2018-07-19 NOTE — ED Provider Notes (Addendum)
[facility] Emergency Department Provider Note  ___________________________________________   First MD Initiated Contact with Patient 07/19/18 2007     (approximate)  I have reviewed the triage vital signs and the nursing notes.   HISTORY  Chief Complaint Dysphagia   HPI Shirley JOURDEN is a 58 y.o. female history of anxiety who is presented emergency department with difficulty swallowing as well as possible slurred speech.  She says that the symptoms started approximately 7 PM and lasted 5 to 10 minutes.  She denies any focal weakness or numbness.  Says that the symptoms appear to be almost 95% resolved at this time.  Family history of stroke but no stroke that she has had in her past.   Past Medical History:  Diagnosis Date  . Anxiety     Patient Active Problem List   Diagnosis Date Noted  . Wrist fracture, bilateral 06/29/2015    Past Surgical History:  Procedure Laterality Date  . MINOR HARDWARE REMOVAL Left 09/18/2015   Procedure: LEFT WRIST HARDWARE REMOVAL, DISTAL RADIUS/ULNA JOINT DEBRIDEMENT ;  Surgeon: Kennedy Bucker, MD;  Location: [facility] ORS;  Service: Orthopedics;  Laterality: Left;  . ORIF WRIST FRACTURE Bilateral 06/30/2015   Procedure: OPEN REDUCTION INTERNAL FIXATION (ORIF) WRIST FRACTURE;  Surgeon: Kennedy Bucker, MD;  Location: [facility] ORS;  Service: Orthopedics;  Laterality: Bilateral;  . TUBAL LIGATION      Prior to Admission medications   Medication Sig Start Date End Date Taking? Authorizing Provider  buPROPion (WELLBUTRIN XL) 150 MG 24 hr tablet Take 150 mg by mouth 2 (two) times daily.     [provider]  HYDROcodone-acetaminophen (NORCO) 5-325 MG tablet Take 1 tablet by mouth every 6 (six) hours as needed for moderate pain. 09/18/15   Kennedy Bucker, MD  Multiple Vitamin (MULTIVITAMIN) tablet Take 1 tablet by mouth daily.    [provider]  pimecrolimus (ELIDEL) 1 % cream Apply 1 application topically daily as needed.  For rash. Apply to affected areas on the face. 10/16/14   [provider]  RESTASIS 0.05 % ophthalmic emulsion Place 1 drop into both eyes 2 (two) times daily. 03/25/15   [provider]  zolpidem (AMBIEN CR) 6.25 MG CR tablet Take 6.25 mg by mouth at bedtime as needed. For sleep. 06/20/15   [provider]    Allergies Sulfa antibiotics  No family history on file.  Social History Social History   Tobacco Use  . Smoking status: Never Smoker  Substance Use Topics  . Alcohol use: No  . Drug use: Not on file    Review of Systems  Constitutional: No fever/chills Eyes: No visual changes. ENT: No sore throat. Cardiovascular: Denies chest pain. Respiratory: Denies shortness of breath. Gastrointestinal: No abdominal pain.  No nausea, no vomiting.  No diarrhea.  No constipation. Genitourinary: Negative for dysuria. Musculoskeletal: Negative for back pain. Skin: Negative for rash. Neurological: Negative for headaches,   ____________________________________________   PHYSICAL EXAM:  VITAL SIGNS: ED Triage Vitals  Enc Vitals Group     BP 07/19/18 1926 125/67     Pulse Rate 07/19/18 1926 80     Resp 07/19/18 1926 16     Temp 07/19/18 1926 97.8 F (36.6 C)     Temp Source 07/19/18 1926 Oral     SpO2 07/19/18 1926 98 %     Weight 07/19/18 1929 120 lb (54.4 kg)     Height 07/19/18 1929 5\' 2"  (1.575 m)     Head  Circumference --      Peak Flow --      Pain Score 07/19/18 1929 0     Pain Loc --      Pain Edu? --      Excl. in GC? --     Constitutional: Alert and oriented. Well appearing and in no acute distress. Eyes: Conjunctivae are normal.  Head: Atraumatic. Nose: No congestion/rhinnorhea. Mouth/Throat: Mucous membranes are moist.  Neck: No stridor.   Cardiovascular: Normal rate, regular rhythm. Grossly normal heart sounds.   Respiratory: Normal respiratory effort.  No retractions. Lungs CTAB. Gastrointestinal: Soft and nontender. No  distention.  Musculoskeletal: No lower extremity tenderness nor edema.  No joint effusions. Neurologic:  Normal speech and language. No gross focal neurologic deficits are appreciated. Skin:  Skin is warm, dry and intact. No rash noted. Psychiatric: Mood and affect are normal. Speech and behavior are normal.  NIH Stroke Scale   Person Administering Scale: Arelia LongestDavid M Schaevitz  Administer stroke scale items in the order listed. Record performance in each category after each subscale exam. Do not go back and change scores. Follow directions provided for each exam technique. Scores should reflect what the patient does, not what the clinician thinks the patient can do. The clinician should record answers while administering the exam and work quickly. Except where indicated, the patient should not be coached (i.e., repeated requests to patient to make a special effort).   1a  Level of consciousness: 0=alert; keenly responsive  1b. LOC questions:  0=Performs both tasks correctly  1c. LOC commands: 0=Performs both tasks correctly  2.  Best Gaze: 0=normal  3.  Visual: 0=No visual loss  4. Facial Palsy: 0=Normal symmetric movement  5a.  Motor left arm: 0=No drift, limb holds 90 (or 45) degrees for full 10 seconds  5b.  Motor right arm: 0=No drift, limb holds 90 (or 45) degrees for full 10 seconds  6a. motor left leg: 0=No drift, limb holds 90 (or 45) degrees for full 10 seconds  6b  Motor right leg:  0=No drift, limb holds 90 (or 45) degrees for full 10 seconds  7. Limb Ataxia: 0=Absent  8.  Sensory: 0=Normal; no sensory loss  9. Best Language:  0=No aphasia, normal  10. Dysarthria: 0=Normal  11. Extinction and Inattention: 0=No abnormality  12. Distal motor function: 0=Normal   Total:   0   ____________________________________________   LABS (all labs ordered are listed, but only abnormal results are displayed)  Labs Reviewed  COMPREHENSIVE METABOLIC PANEL - Abnormal; Notable for the  following components:      Result Value   Glucose, Bld 112 (*)    All other components within normal limits  ETHANOL  PROTIME-INR  APTT  CBC  DIFFERENTIAL  TROPONIN I  URINE DRUG SCREEN, QUALITATIVE ([facility] ONLY)  URINALYSIS, ROUTINE W REFLEX MICROSCOPIC   ____________________________________________  EKG  ED ECG REPORT I, Arelia Longestavid M Schaevitz, the attending physician, personally viewed and interpreted this ECG.   Date: 07/19/2018  EKG Time: 1958  Rate: 73  Rhythm: normal sinus rhythm  Axis: Normal  Intervals:none  ST&T Change: No ST segment elevation or depression.  No abnormal T wave inversion.  ____________________________________________  RADIOLOGY  CT head negative. ____________________________________________   PROCEDURES  Procedure(s) performed:   Procedures  Critical Care performed:   ____________________________________________   INITIAL IMPRESSION / ASSESSMENT AND PLAN / ED COURSE  Pertinent labs & imaging results that were available during my care of the patient were reviewed by  me and considered in my medical decision making (see chart for details).  DDX: TIA, CVA, electrolyte abnormality, globus sensation, dry mouth As part of my medical decision making, I reviewed the following data within the electronic MEDICAL RECORD NUMBER Notes from prior ED visits  ----------------------------------------- 10:31 PM on 07/19/2018 -----------------------------------------  Patient evaluated by Dr. Lewis MoccasinMasud tele-neurology for code stroke because the patient was within the window.  Patient not a TPA candidate but was recommended to stay for further work-up and treatment including MRI.  However, patient is refusing at this time.  She is aware of possible return of symptoms and worsening neurologic compromise which may become permanent.  She says that she will return immediately for any worsening concerning symptoms and will follow-up with her primary care doctor otherwise  to complete the work-up.  Will be started on aspirin, 81 mg daily.  She is understanding of the diagnosis well treatment and willing to comply.  Patient has good insight into her clinical condition and capacity to make decisions at this time.  She is not clinically intoxicated.  At the time of discharge the patient is completely neurologically intact/asymptomatic.  ____________________________________________   FINAL CLINICAL IMPRESSION(S) / ED DIAGNOSES  TIA.  NEW MEDICATIONS STARTED DURING THIS VISIT:  New Prescriptions   No medications on file     Note:  This document was prepared using Dragon voice recognition software and may include unintentional dictation errors.     Myrna BlazerSchaevitz, David Matthew, MD 07/19/18 2233    Myrna BlazerSchaevitz, David Matthew, MD 07/19/18 2233

## 2018-07-19 NOTE — Consult Note (Signed)
[facility] Consult Services   Date of Service:   07/19/2018 20:51:17  Impression:     .  Transient Ischemic Attack  Comments: 58 YO F with no significant PMH presented with sudden onset trouble swallowing and slurred speech which lasted about a minute. likely a TIA.  Metrics: Last Known Well: 07/19/2018 19:00:00 [facility] Notification Time: 07/19/2018 20:51:17 Arrival Time: 07/19/2018 19:09:00 Stamp Time: 07/19/2018 20:51:17 Time First Login Attempt: 07/19/2018 20:58:39 Video Start Time: 07/19/2018 20:58:39  Symptoms: slurred speech and trouble swallowing NIHSS Start Assessment Time: 07/19/2018 21:00:12 Patient is not a candidate for tPA. Patient was not deemed candidate for tPA thrombolytics because of Resolved symptoms. Video End Time: 07/19/2018 21:11:43  CT head showed no acute hemorrhage or acute core infarct.  Presentation is not suggestive of Large Vessel Occlusive disease.  ED Physician notified of diagnostic impression and management plan on 07/19/2018 21:15:23  Our recommendations are outlined below.  Recommendations:     .  Activate Stroke Protocol Admission/Order Set     .  Stroke/Telemetry Floor     .  Neuro Checks     .  Bedside Swallow Eval     .  DVT Prophylaxis     .  Initiate Aspirin 81 MG Daily Lipitor 80 mg daily.    Sign Out:     .  Discussed with Emergency Department Provider    ------------------------------------------------------------------------------  History of Present Illness: Patient is a 58 year old Female.  Patient was brought by private transportation with symptoms of slurred speech and trouble swallowing  58 YO F with no significant PMH presented with sudden onset trouble swallowing and slurred speech which lasted about a minute. She states that her symptoms were sudden onset. Speech symptoms resolved in a minute but swallowing slowly got better. Denies any focal weakness, sensory or visual  symptoms. Admission was recommended but she does not want to stay. Since her ABCD2 score is low, she can follow up with PMD and get the testing done as outpatient.  CT head showed no acute hemorrhage or acute core infarct.    Examination: 1A: Level of Consciousness - Alert; keenly responsive + 0 1B: Ask Month and Age - Both Questions Right + 0 1C: Blink Eyes & Squeeze Hands - Performs Both Tasks + 0 2: Test Horizontal Extraocular Movements - Normal + 0 3: Test Visual Fields - No Visual Loss + 0 4: Test Facial Palsy (Use Grimace if Obtunded) - Normal symmetry + 0 5A: Test Left Arm Motor Drift - No Drift for 10 Seconds + 0 5B: Test Right Arm Motor Drift - No Drift for 10 Seconds + 0 6A: Test Left Leg Motor Drift - No Drift for 5 Seconds + 0 6B: Test Right Leg Motor Drift - No Drift for 5 Seconds + 0 7: Test Limb Ataxia (FNF/Heel-Shin) - No Ataxia + 0 8: Test Sensation - Normal; No sensory loss + 0 9: Test Language/Aphasia - Normal; No aphasia + 0 10: Test Dysarthria - Normal + 0 11: Test Extinction/Inattention - No abnormality + 0  NIHSS Score: 0  Patient was informed the Neurology Consult would happen via TeleHealth consult by way of interactive audio and video telecommunications and consented to receiving care in this manner.  Due to the immediate potential for life-threatening deterioration due to underlying acute neurologic illness, I spent 35 minutes providing critical care. This time includes time for face to face visit via telemedicine, review of medical records, imaging studies and discussion of  findings with providers, the patient and/or family.   Dr Venia CarbonMuhammad Lilianah Buffin   [facility] [phone]  Case [id number]

## 2018-07-19 NOTE — ED Triage Notes (Addendum)
Pt had acute onset of dysphagia that started 30 min ago ,states symptoms have improved but still not back to baseline. States she felt like she had slurred speech, but unsure if mouth was dry. Has had dizziness at times, for months. States last known well time 1900 today.

## 2019-05-02 ENCOUNTER — Other Ambulatory Visit: Payer: Self-pay

## 2019-05-02 DIAGNOSIS — Z20822 Contact with and (suspected) exposure to covid-19: Secondary | ICD-10-CM

## 2019-05-04 LAB — NOVEL CORONAVIRUS, NAA: SARS-CoV-2, NAA: DETECTED — AB

## 2021-09-17 ENCOUNTER — Other Ambulatory Visit: Payer: Self-pay | Admitting: Family Medicine

## 2021-09-17 DIAGNOSIS — Z1231 Encounter for screening mammogram for malignant neoplasm of breast: Secondary | ICD-10-CM

## 2021-09-17 DIAGNOSIS — S22000A Wedge compression fracture of unspecified thoracic vertebra, initial encounter for closed fracture: Secondary | ICD-10-CM

## 2021-11-25 ENCOUNTER — Ambulatory Visit
Admission: RE | Admit: 2021-11-25 | Discharge: 2021-11-25 | Disposition: A | Discharge status: Home / Self Care | Payer: BC Managed Care – PPO | Source: Ambulatory Visit | Attending: Family Medicine | Admitting: Family Medicine

## 2021-11-25 DIAGNOSIS — S22000A Wedge compression fracture of unspecified thoracic vertebra, initial encounter for closed fracture: Secondary | ICD-10-CM | POA: Diagnosis present

## 2021-11-25 DIAGNOSIS — Z1231 Encounter for screening mammogram for malignant neoplasm of breast: Secondary | ICD-10-CM | POA: Diagnosis present

## 2021-12-03 ENCOUNTER — Inpatient Hospital Stay
Admission: RE | Admit: 2021-12-03 | Discharge: 2021-12-03 | Disposition: A | Discharge status: Home / Self Care | Payer: Self-pay | Source: Ambulatory Visit | Attending: *Deleted | Admitting: *Deleted

## 2021-12-03 ENCOUNTER — Other Ambulatory Visit: Payer: Self-pay | Admitting: *Deleted

## 2021-12-03 DIAGNOSIS — Z1231 Encounter for screening mammogram for malignant neoplasm of breast: Secondary | ICD-10-CM

## 2022-06-02 ENCOUNTER — Other Ambulatory Visit: Payer: Self-pay | Admitting: Unknown Physician Specialty

## 2022-06-02 DIAGNOSIS — M25551 Pain in right hip: Secondary | ICD-10-CM

## 2022-06-15 ENCOUNTER — Ambulatory Visit
Admission: RE | Admit: 2022-06-15 | Discharge: 2022-06-15 | Disposition: A | Discharge status: Home / Self Care | Payer: BC Managed Care – PPO | Source: Ambulatory Visit | Attending: Unknown Physician Specialty | Admitting: Unknown Physician Specialty

## 2022-06-15 DIAGNOSIS — M25551 Pain in right hip: Secondary | ICD-10-CM | POA: Diagnosis present

## 2023-03-30 ENCOUNTER — Other Ambulatory Visit: Payer: Self-pay | Admitting: Family Medicine

## 2023-03-30 DIAGNOSIS — Z1231 Encounter for screening mammogram for malignant neoplasm of breast: Secondary | ICD-10-CM

## 2023-04-13 ENCOUNTER — Ambulatory Visit
Admission: RE | Admit: 2023-04-13 | Discharge: 2023-04-13 | Disposition: A | Discharge status: Home / Self Care | Payer: BC Managed Care – PPO | Source: Ambulatory Visit | Attending: Family Medicine | Admitting: Family Medicine

## 2023-04-13 DIAGNOSIS — Z1231 Encounter for screening mammogram for malignant neoplasm of breast: Secondary | ICD-10-CM | POA: Insufficient documentation

## 2023-08-11 IMAGING — MG MM DIGITAL SCREENING BILAT W/ TOMO AND CAD
8 series · 8 of 24 positions shown · non-contrast
Comparison: Previous exams 08/01/2019 and earlier from [facility]
Awaiting the prior outside mammograms accounts for the delay in this
report.

CLINICAL DATA: Screening.

EXAM:
DIGITAL SCREENING BILATERAL MAMMOGRAM WITH TOMOSYNTHESIS AND CAD
TECHNIQUE: Bilateral screening digital craniocaudal and mediolateral oblique
mammograms were obtained. Bilateral screening digital breast
tomosynthesis was performed. The images were evaluated with
computer-aided detection.

[L MLO synth-2D]
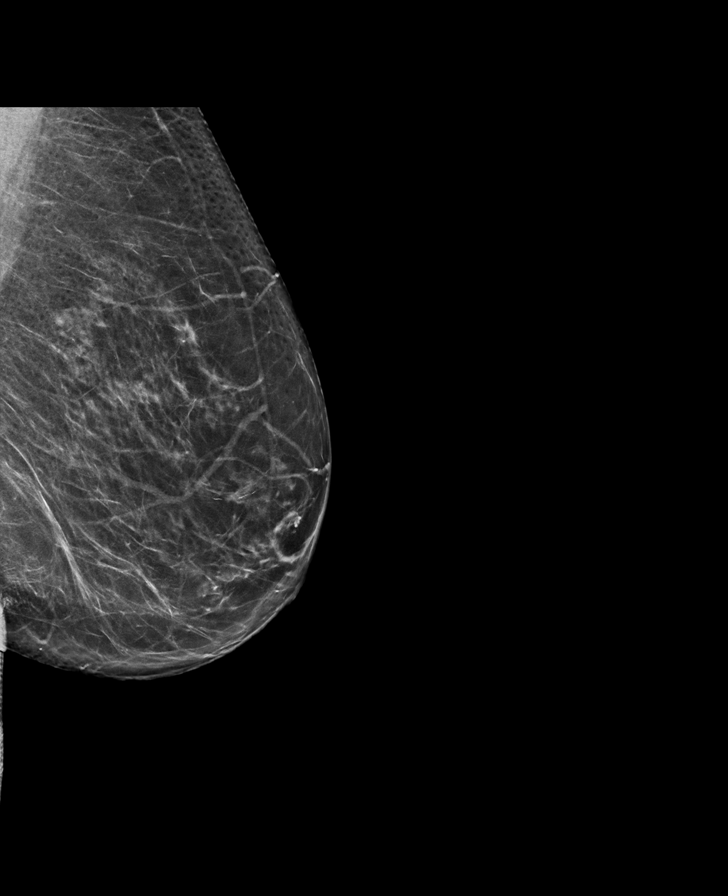

[R CC synth-2D]
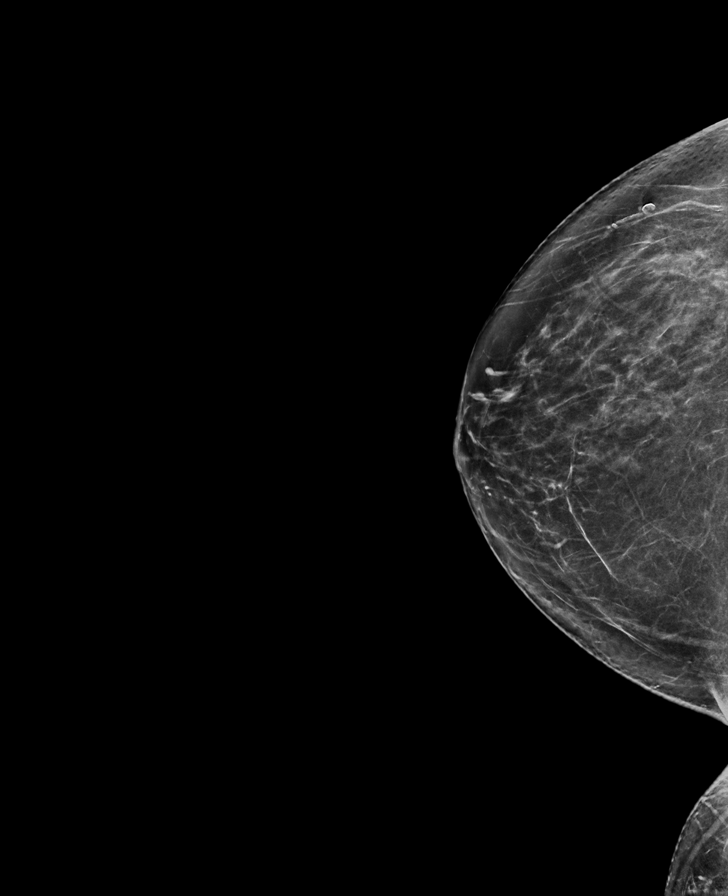

[R MLO synth-2D]
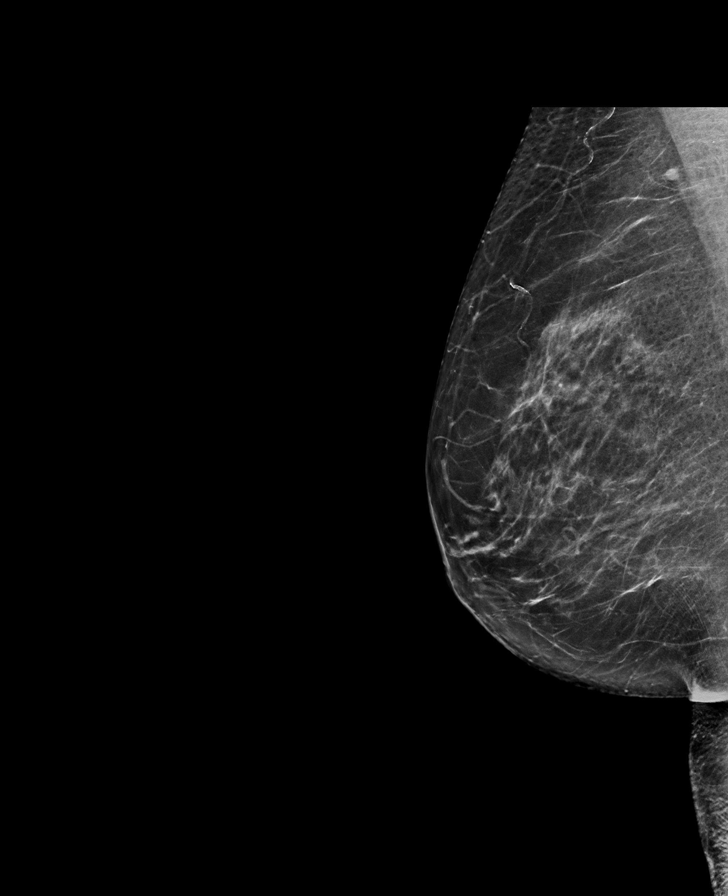

[L CC synth-2D]
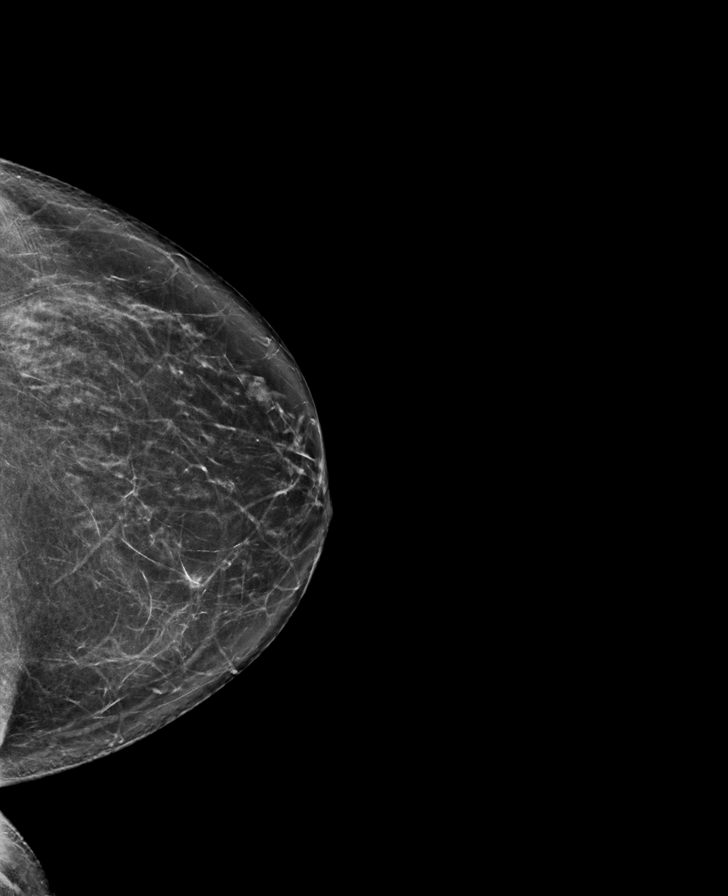

[R MLO tomo · tomo slice 37/74.0]
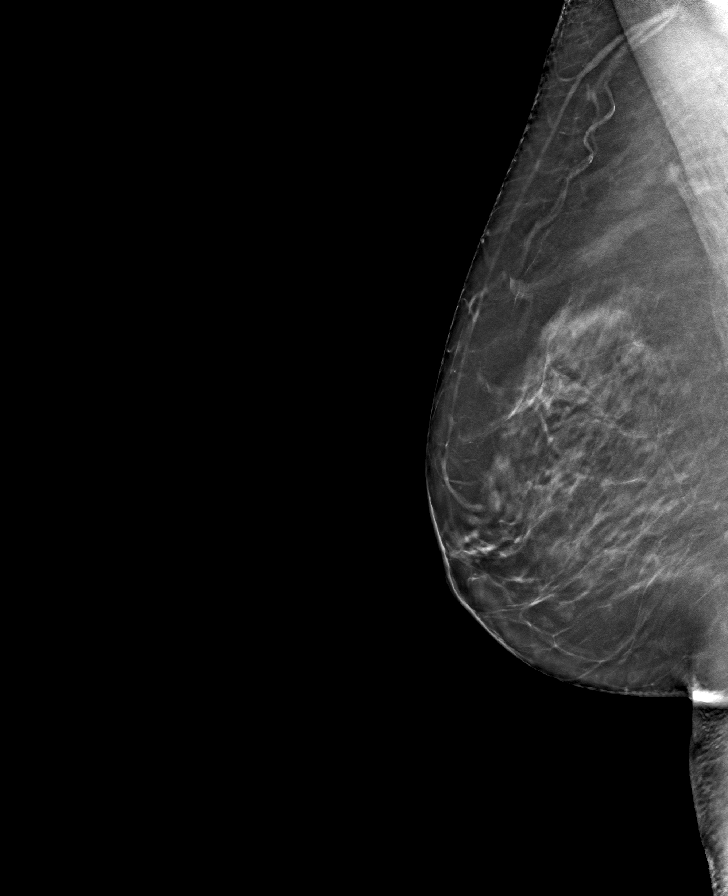

[L MLO tomo · tomo slice 36/71.0]
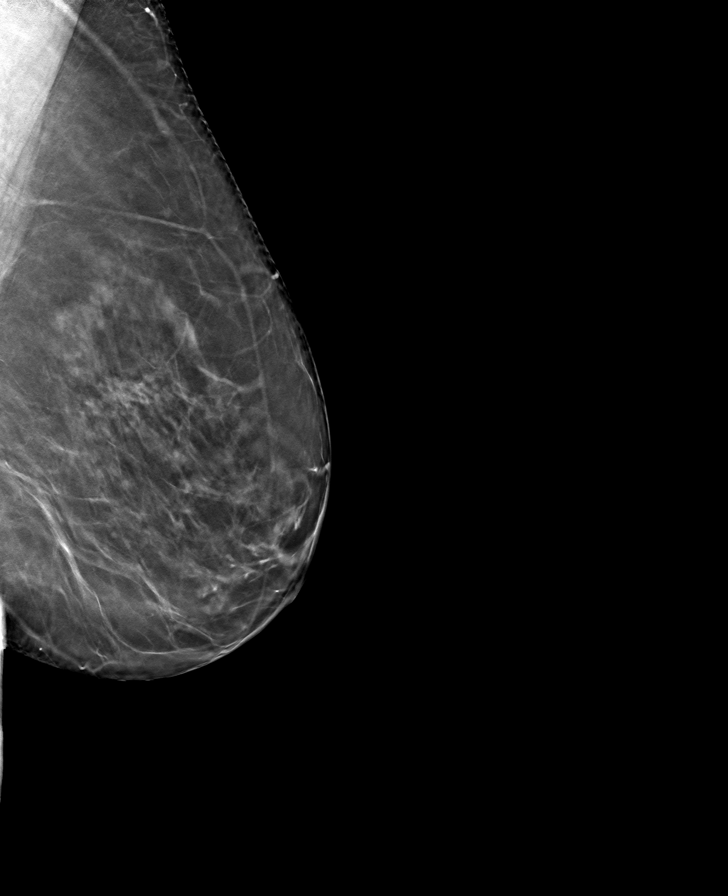

[R CC tomo · tomo slice 37/73.0]
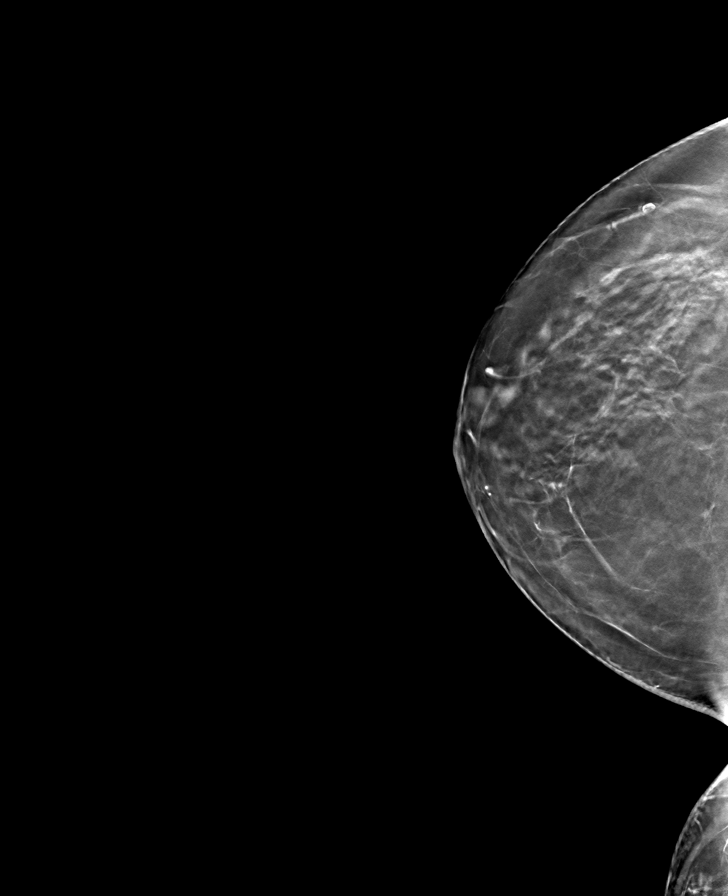

[L CC tomo · tomo slice 37/74.0]
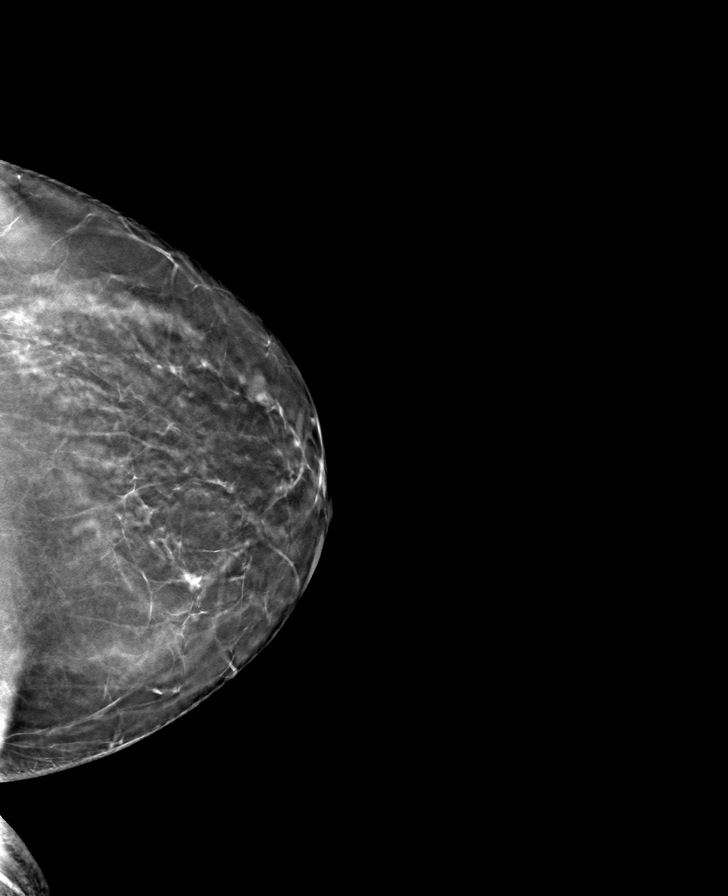

[8 of 24 positions shown; findings below may reference images not displayed]

ACR Breast Density Category b: There are scattered areas of
fibroglandular density.
FINDINGS: There are no findings suspicious for malignancy.
IMPRESSION: No mammographic evidence of malignancy. A result letter of this
screening mammogram will be mailed directly to the patient.

RECOMMENDATION:
Screening mammogram in one year. (Code:N5-1-740)

BI-RADS CATEGORY  1: Negative.

## 2023-10-12 ENCOUNTER — Other Ambulatory Visit: Payer: Self-pay | Admitting: "Endocrinology

## 2023-10-12 DIAGNOSIS — M810 Age-related osteoporosis without current pathological fracture: Secondary | ICD-10-CM

## 2023-12-15 ENCOUNTER — Other Ambulatory Visit

## 2023-12-20 ENCOUNTER — Ambulatory Visit
Admission: RE | Admit: 2023-12-20 | Discharge: 2023-12-20 | Disposition: A | Discharge status: Home / Self Care | Source: Ambulatory Visit | Attending: "Endocrinology | Admitting: "Endocrinology

## 2023-12-20 DIAGNOSIS — M810 Age-related osteoporosis without current pathological fracture: Secondary | ICD-10-CM | POA: Diagnosis present

## 2024-05-24 ENCOUNTER — Other Ambulatory Visit: Payer: Self-pay | Admitting: Family Medicine

## 2024-05-24 DIAGNOSIS — Z1231 Encounter for screening mammogram for malignant neoplasm of breast: Secondary | ICD-10-CM

## 2024-06-07 ENCOUNTER — Inpatient Hospital Stay: Admission: RE | Admit: 2024-06-07

## 2024-06-07 DIAGNOSIS — Z1231 Encounter for screening mammogram for malignant neoplasm of breast: Secondary | ICD-10-CM | POA: Insufficient documentation
